# Patient Record
Sex: Female | Born: 1945 | Race: Asian | Hispanic: No | Marital: Married | Smoking: Never smoker
Health system: Southern US, Community
[De-identification: ages and names within clinical notes are randomized; demographics above are authoritative.]

## PROBLEM LIST (undated history)

## (undated) DIAGNOSIS — H409 Unspecified glaucoma: Secondary | ICD-10-CM

## (undated) DIAGNOSIS — F32A Depression, unspecified: Secondary | ICD-10-CM

## (undated) DIAGNOSIS — F329 Major depressive disorder, single episode, unspecified: Secondary | ICD-10-CM

## (undated) DIAGNOSIS — I10 Essential (primary) hypertension: Secondary | ICD-10-CM

## (undated) DIAGNOSIS — E119 Type 2 diabetes mellitus without complications: Secondary | ICD-10-CM

## (undated) HISTORY — DX: Unspecified glaucoma: H40.9

## (undated) HISTORY — PX: EYE SURGERY: SHX253

## (undated) HISTORY — DX: Depression, unspecified: F32.A

## (undated) HISTORY — DX: Major depressive disorder, single episode, unspecified: F32.9

## (undated) HISTORY — PX: TUBAL LIGATION: SHX77

---

## 2004-03-24 ENCOUNTER — Ambulatory Visit (HOSPITAL_COMMUNITY): Admission: RE | Admit: 2004-03-24 | Discharge: 2004-03-24 | Payer: Self-pay | Admitting: Gastroenterology

## 2006-05-10 ENCOUNTER — Ambulatory Visit (HOSPITAL_COMMUNITY): Admission: RE | Admit: 2006-05-10 | Discharge: 2006-05-10 | Payer: Self-pay | Admitting: Gastroenterology

## 2008-04-30 ENCOUNTER — Ambulatory Visit (HOSPITAL_COMMUNITY): Admission: RE | Admit: 2008-04-30 | Discharge: 2008-04-30 | Payer: Self-pay | Admitting: Internal Medicine

## 2011-04-06 NOTE — Op Note (Signed)
NAME:  Robin Perry, Robin Perry                               ACCOUNT NO.:  [id number]   MEDICAL RECORD NO.:  [id number]                   PATIENT TYPE:  AMB   LOCATION:  ENDO                                 FACILITY:  [facility]   PHYSICIAN:  Anselmo Rod, M.D.               DATE OF BIRTH:  1946/10/18   DATE OF PROCEDURE:  03/24/2004  DATE OF DISCHARGE:                                 OPERATIVE REPORT   PROCEDURE PERFORMED:  Screening colonoscopy.   ENDOSCOPIST:  Anselmo Rod, M.D.   INSTRUMENT USED:  Olympus video colonoscope.   INDICATION FOR PROCEDURE:  A 65 year old Asian woman undergoing a screening  colonoscopy.  The patient has a history of abdominal pain and questionable  black stool.  She was guaiac-negative in the office on physical exam.  Rule  out colonic polyps, masses, etc.   PREPROCEDURE PREPARATION:  Informed consent was procured from the patient.  The patient was fasted for eight hours prior to the procedure and prepped  with a bottle of magnesium citrate and a gallon of GoLYTELY the night prior  to the procedure.   PREPROCEDURE PHYSICAL:  VITAL SIGNS:  The patient had stable vital signs.  NECK:  Supple.  CHEST:  Clear to auscultation.  S1, S2 regular.  ABDOMEN:  Soft with normal bowel sounds.   DESCRIPTION OF PROCEDURE:  The patient was placed in the left lateral  decubitus position and sedated with 50 mg of Demerol and 5 mg of Versed in  slow incremental doses.  Once the patient was adequately sedate and  maintained on low-flow oxygen and continuous cardiac monitoring, the  pediatric adjustable Olympus colonoscope was advanced from the rectum to the  cecum.  There was a large amount of residual stool in the right colon.  Multiple washes were done.  Small lesions could have been missed.  The  patient's position was changed from the left lateral to the supine position  to move the stool pool and visualize the underlying mucosa.  The appendiceal  orifice and the ileocecal  valve were visualized and photographed.  No  masses, polyps, erosions, ulcerations, or diverticula were identified.   IMPRESSION:  1. Essentially normal colonoscopy up to the cecum.  2. Large amount of residual stool in the right colon, small lesions could     have been missed.   RECOMMENDATIONS:  1. Repeat CRC screening is recommended in the next five years unless the     patient develops any abnormal     symptoms in the interim.  2. Outpatient follow-up with repeat guaiac testing.  3. Continue on a high-fiber diet with liberal fluid intake.  Anselmo Rod, M.D.    JNM/MEDQ  D:  03/24/2004  Perry:  03/25/2004  Job:  [id number]   cc:   Geoffry Paradise, M.D.  [address]  [place]  [place] [address]  Fax: [phone]   Rudy Jew. Ashley Royalty, M.D.  [address]  [place], [place] [address]  Fax: [phone]

## 2013-08-19 ENCOUNTER — Inpatient Hospital Stay (HOSPITAL_COMMUNITY)
Admission: EM | Admit: 2013-08-19 | Discharge: 2013-08-25 | DRG: 917 | Disposition: A | Discharge status: Other Acute Inpatient Hospital | Payer: Medicare Other | Attending: Internal Medicine | Admitting: Internal Medicine

## 2013-08-19 ENCOUNTER — Emergency Department (HOSPITAL_COMMUNITY): Payer: Medicare Other

## 2013-08-19 ENCOUNTER — Inpatient Hospital Stay (HOSPITAL_COMMUNITY): Payer: Medicare Other

## 2013-08-19 ENCOUNTER — Encounter (HOSPITAL_COMMUNITY): Payer: Self-pay

## 2013-08-19 DIAGNOSIS — E871 Hypo-osmolality and hyponatremia: Secondary | ICD-10-CM

## 2013-08-19 DIAGNOSIS — I10 Essential (primary) hypertension: Secondary | ICD-10-CM

## 2013-08-19 DIAGNOSIS — F29 Unspecified psychosis not due to a substance or known physiological condition: Secondary | ICD-10-CM

## 2013-08-19 DIAGNOSIS — X838XXA Intentional self-harm by other specified means, initial encounter: Secondary | ICD-10-CM

## 2013-08-19 DIAGNOSIS — T50901A Poisoning by unspecified drugs, medicaments and biological substances, accidental (unintentional), initial encounter: Secondary | ICD-10-CM

## 2013-08-19 DIAGNOSIS — E119 Type 2 diabetes mellitus without complications: Secondary | ICD-10-CM | POA: Diagnosis present

## 2013-08-19 DIAGNOSIS — T1491XA Suicide attempt, initial encounter: Secondary | ICD-10-CM | POA: Diagnosis present

## 2013-08-19 DIAGNOSIS — J189 Pneumonia, unspecified organism: Secondary | ICD-10-CM | POA: Diagnosis present

## 2013-08-19 HISTORY — DX: Essential (primary) hypertension: I10

## 2013-08-19 HISTORY — DX: Type 2 diabetes mellitus without complications: E11.9

## 2013-08-19 LAB — COMPREHENSIVE METABOLIC PANEL
AST: 36 U/L (ref 0–37)
Calcium: 8.9 mg/dL (ref 8.4–10.5)
GFR calc Af Amer: 74 mL/min — ABNORMAL LOW (ref >90)
Potassium: 4 mEq/L (ref 3.5–5.1)
Total Bilirubin: 0.9 mg/dL (ref 0.3–1.2)
Total Protein: 6.1 g/dL (ref 6.0–8.3)

## 2013-08-19 LAB — BLOOD GAS, ARTERIAL
Acid-base deficit: 7 mmol/L — ABNORMAL HIGH (ref 0.0–2.0)
Bicarbonate: 16.4 mEq/L — ABNORMAL LOW (ref 20.0–24.0)
Drawn by: 11249
FIO2: 0.21 %
O2 Saturation: 95.5 %
Patient temperature: 98.6

## 2013-08-19 LAB — CBC WITH DIFFERENTIAL/PLATELET
Basophils Absolute: 0 10*3/uL (ref 0.0–0.1)
Basophils Absolute: 0 10*3/uL (ref 0.0–0.1)
Eosinophils Relative: 0 % (ref 0–5)
Eosinophils Relative: 0 % (ref 0–5)
HCT: 36.1 % (ref 36.0–46.0)
Hemoglobin: 12.9 g/dL (ref 12.0–15.0)
Lymphocytes Relative: 12 % (ref 12–46)
Lymphs Abs: 1.2 10*3/uL (ref 0.7–4.0)
Lymphs Abs: 1.3 10*3/uL (ref 0.7–4.0)
MCH: 31.6 pg (ref 26.0–34.0)
MCH: 31.3 pg (ref 26.0–34.0)
MCHC: 36.2 g/dL — ABNORMAL HIGH (ref 30.0–36.0)
MCV: 87.5 fL (ref 78.0–100.0)
MCV: 87.6 fL (ref 78.0–100.0)
Monocytes Absolute: 1.9 10*3/uL — ABNORMAL HIGH (ref 0.1–1.0)
Monocytes Absolute: 1.2 10*3/uL — ABNORMAL HIGH (ref 0.1–1.0)
Monocytes Relative: 12 % (ref 3–12)
Neutro Abs: 14.4 10*3/uL — ABNORMAL HIGH (ref 1.7–7.7)
Neutro Abs: 8 10*3/uL — ABNORMAL HIGH (ref 1.7–7.7)
Neutrophils Relative %: 82 % — ABNORMAL HIGH (ref 43–77)
RBC: 4.55 MIL/uL (ref 3.87–5.11)
RBC: 4.12 MIL/uL (ref 3.87–5.11)
RDW: 13.2 % (ref 11.5–15.5)
WBC: 10.5 10*3/uL (ref 4.0–10.5)

## 2013-08-19 LAB — BASIC METABOLIC PANEL
Chloride: 94 mEq/L — ABNORMAL LOW (ref 96–112)
Chloride: 95 mEq/L — ABNORMAL LOW (ref 96–112)
GFR calc Af Amer: >90 mL/min (ref >90)
GFR calc Af Amer: >90 mL/min (ref >90)
GFR calc non Af Amer: 88 mL/min — ABNORMAL LOW (ref >90)
Glucose, Bld: 144 mg/dL — ABNORMAL HIGH (ref 70–99)
Potassium: 3.5 mEq/L (ref 3.5–5.1)
Potassium: 3.6 mEq/L (ref 3.5–5.1)
Sodium: 126 mEq/L — ABNORMAL LOW (ref 135–145)

## 2013-08-19 LAB — GLUCOSE, CAPILLARY
Glucose-Capillary: 189 mg/dL — ABNORMAL HIGH (ref 70–99)
Glucose-Capillary: 166 mg/dL — ABNORMAL HIGH (ref 70–99)

## 2013-08-19 LAB — URINALYSIS, ROUTINE W REFLEX MICROSCOPIC
Glucose, UA: >1000 mg/dL — AB
Leukocytes, UA: NEGATIVE
Protein, ur: NEGATIVE mg/dL
Specific Gravity, Urine: 1.025 (ref 1.005–1.030)
pH: 5.5 (ref 5.0–8.0)

## 2013-08-19 LAB — OCCULT BLOOD, POC DEVICE: Fecal Occult Bld: POSITIVE — AB

## 2013-08-19 LAB — TYPE AND SCREEN
ABO/RH(D): O POS
Antibody Screen: NEGATIVE

## 2013-08-19 LAB — SODIUM, URINE, RANDOM: Sodium, Ur: 130 mEq/L

## 2013-08-19 LAB — RAPID URINE DRUG SCREEN, HOSP PERFORMED
Benzodiazepines: NOT DETECTED
Tetrahydrocannabinol: NOT DETECTED

## 2013-08-19 LAB — ETHANOL: Alcohol, Ethyl (B): <11 mg/dL (ref 0–11)

## 2013-08-19 LAB — LACTIC ACID, PLASMA: Lactic Acid, Venous: 2.8 mmol/L — ABNORMAL HIGH (ref 0.5–2.2)

## 2013-08-19 LAB — OSMOLALITY, URINE: Osmolality, Ur: 625 mOsm/kg (ref 390–1090)

## 2013-08-19 MED ORDER — CLINDAMYCIN PHOSPHATE 600 MG/50ML IV SOLN
INTRAVENOUS | Status: AC
Start: 2013-08-19 — End: 2013-08-19
  Administered 2013-08-19: 600 mg via INTRAVENOUS
  Filled 2013-08-19: qty 50

## 2013-08-19 MED ORDER — BRIMONIDINE TARTRATE 0.2 % OP SOLN
1.0000 [drp] | Freq: Two times a day (BID) | OPHTHALMIC | Status: DC
  Administered 2013-08-19 – 2013-08-25 (×13): 1 [drp] via OPHTHALMIC
  Filled 2013-08-19: qty 5

## 2013-08-19 MED ORDER — ONDANSETRON HCL 4 MG/2ML IJ SOLN: 4.0000 mg | Freq: Four times a day (QID) | INTRAMUSCULAR | Status: DC | PRN

## 2013-08-19 MED ORDER — IOHEXOL 300 MG/ML  SOLN
80.0000 mL | Freq: Once | INTRAMUSCULAR | Status: AC | PRN
  Administered 2013-08-19: 80 mL via INTRAVENOUS

## 2013-08-19 MED ORDER — MORPHINE SULFATE 4 MG/ML IJ SOLN
INTRAMUSCULAR | Status: AC
  Administered 2013-08-19: 4 mg via INTRAVENOUS
  Filled 2013-08-19: qty 1

## 2013-08-19 MED ORDER — SUCRALFATE 1 GM/10ML PO SUSP
1.0000 g | Freq: Four times a day (QID) | ORAL | Status: DC
  Administered 2013-08-19 (×2): 1 g via ORAL
  Filled 2013-08-19 (×4): qty 10

## 2013-08-19 MED ORDER — SUCRALFATE 1 GM/10ML PO SUSP
1.0000 g | Freq: Four times a day (QID) | ORAL | Status: DC
  Administered 2013-08-19 – 2013-08-25 (×22): 1 g via ORAL
  Filled 2013-08-19 (×25): qty 10

## 2013-08-19 MED ORDER — ALBUTEROL SULFATE (5 MG/ML) 0.5% IN NEBU
2.5000 mg | INHALATION_SOLUTION | RESPIRATORY_TRACT | Status: DC | PRN
  Administered 2013-08-19 – 2013-08-21 (×4): 2.5 mg via RESPIRATORY_TRACT
  Filled 2013-08-19 (×4): qty 0.5

## 2013-08-19 MED ORDER — ONDANSETRON HCL 4 MG/2ML IJ SOLN
4.0000 mg | Freq: Once | INTRAMUSCULAR | Status: AC
  Administered 2013-08-19: 4 mg via INTRAVENOUS

## 2013-08-19 MED ORDER — SODIUM CHLORIDE 0.9 % IJ SOLN
3.0000 mL | Freq: Two times a day (BID) | INTRAMUSCULAR | Status: DC
  Administered 2013-08-19 – 2013-08-25 (×8): 3 mL via INTRAVENOUS

## 2013-08-19 MED ORDER — MORPHINE SULFATE 4 MG/ML IJ SOLN
4.0000 mg | Freq: Once | INTRAMUSCULAR | Status: AC
  Administered 2013-08-19: 4 mg via INTRAVENOUS

## 2013-08-19 MED ORDER — ACETAMINOPHEN 650 MG RE SUPP: 650.0000 mg | Freq: Four times a day (QID) | RECTAL | Status: DC | PRN

## 2013-08-19 MED ORDER — SODIUM CHLORIDE 0.9 % IV BOLUS (SEPSIS)
1000.0000 mL | Freq: Once | INTRAVENOUS | Status: AC
  Administered 2013-08-19: 1000 mL via INTRAVENOUS

## 2013-08-19 MED ORDER — MORPHINE SULFATE 2 MG/ML IJ SOLN
1.0000 mg | INTRAMUSCULAR | Status: DC | PRN
  Administered 2013-08-19 – 2013-08-20 (×4): 1 mg via INTRAVENOUS
  Filled 2013-08-19 (×4): qty 1

## 2013-08-19 MED ORDER — ACETAMINOPHEN 325 MG PO TABS: 650.0000 mg | ORAL_TABLET | Freq: Four times a day (QID) | ORAL | Status: DC | PRN

## 2013-08-19 MED ORDER — ONDANSETRON HCL 4 MG/2ML IJ SOLN
INTRAMUSCULAR | Status: AC
  Filled 2013-08-19: qty 2

## 2013-08-19 MED ORDER — SODIUM CHLORIDE 0.9 % IV SOLN
80.0000 mg | Freq: Once | INTRAVENOUS | Status: AC
  Administered 2013-08-19: 80 mg via INTRAVENOUS
  Filled 2013-08-19: qty 80

## 2013-08-19 MED ORDER — HYDRALAZINE HCL 20 MG/ML IJ SOLN: 10.0000 mg | INTRAMUSCULAR | Status: DC | PRN

## 2013-08-19 MED ORDER — CLINDAMYCIN PHOSPHATE 600 MG/50ML IV SOLN
600.0000 mg | Freq: Three times a day (TID) | INTRAVENOUS | Status: DC
  Administered 2013-08-19 – 2013-08-23 (×13): 600 mg via INTRAVENOUS
  Filled 2013-08-19 (×17): qty 50

## 2013-08-19 MED ORDER — PNEUMOCOCCAL VAC POLYVALENT 25 MCG/0.5ML IJ INJ
0.5000 mL | INJECTION | INTRAMUSCULAR | Status: AC
  Administered 2013-08-20: 10:00:00 0.5 mL via INTRAMUSCULAR
  Filled 2013-08-19 (×2): qty 0.5

## 2013-08-19 MED ORDER — GI COCKTAIL ~~LOC~~
30.0000 mL | Freq: Once | ORAL | Status: AC
  Administered 2013-08-19: 30 mL via ORAL

## 2013-08-19 MED ORDER — INSULIN ASPART 100 UNIT/ML ~~LOC~~ SOLN
0.0000 [IU] | Freq: Three times a day (TID) | SUBCUTANEOUS | Status: DC
  Administered 2013-08-19 (×2): 2 [IU] via SUBCUTANEOUS
  Administered 2013-08-19: 10:00:00 3 [IU] via SUBCUTANEOUS
  Administered 2013-08-20 – 2013-08-21 (×6): 2 [IU] via SUBCUTANEOUS
  Administered 2013-08-22: 1 [IU] via SUBCUTANEOUS
  Administered 2013-08-22: 08:00:00 via SUBCUTANEOUS
  Administered 2013-08-22: 2 [IU] via SUBCUTANEOUS
  Administered 2013-08-23: 3 [IU] via SUBCUTANEOUS
  Administered 2013-08-23: 5 [IU] via SUBCUTANEOUS
  Administered 2013-08-23: 3 [IU] via SUBCUTANEOUS
  Administered 2013-08-24: 2 [IU] via SUBCUTANEOUS
  Administered 2013-08-24: 5 [IU] via SUBCUTANEOUS

## 2013-08-19 MED ORDER — GI COCKTAIL ~~LOC~~
ORAL | Status: AC
  Filled 2013-08-19: qty 30

## 2013-08-19 MED ORDER — INFLUENZA VAC SPLIT QUAD 0.5 ML IM SUSP
0.5000 mL | INTRAMUSCULAR | Status: AC
  Administered 2013-08-20: 0.5 mL via INTRAMUSCULAR
  Filled 2013-08-19 (×2): qty 0.5

## 2013-08-19 MED ORDER — PANTOPRAZOLE SODIUM 40 MG IV SOLR
40.0000 mg | Freq: Two times a day (BID) | INTRAVENOUS | Status: DC
  Administered 2013-08-19 – 2013-08-25 (×12): 40 mg via INTRAVENOUS
  Filled 2013-08-19 (×14): qty 40

## 2013-08-19 MED ORDER — CLINDAMYCIN PHOSPHATE 600 MG/50ML IV SOLN
600.0000 mg | Freq: Once | INTRAVENOUS | Status: DC
  Administered 2013-08-19: 600 mg via INTRAVENOUS

## 2013-08-19 MED ORDER — SODIUM CHLORIDE 0.9 % IV SOLN
INTRAVENOUS | Status: AC
  Administered 2013-08-19 – 2013-08-20 (×2): via INTRAVENOUS

## 2013-08-19 MED ORDER — ONDANSETRON HCL 4 MG PO TABS: 4.0000 mg | ORAL_TABLET | Freq: Four times a day (QID) | ORAL | Status: DC | PRN

## 2013-08-19 MED ORDER — DORZOLAMIDE HCL-TIMOLOL MAL 2-0.5 % OP SOLN
1.0000 [drp] | Freq: Two times a day (BID) | OPHTHALMIC | Status: DC
  Administered 2013-08-19 – 2013-08-25 (×13): 1 [drp] via OPHTHALMIC
  Filled 2013-08-19: qty 10

## 2013-08-19 NOTE — ED Notes (Signed)
Bed: ZO10 Expected date: 08/19/13 Expected time: 12:19 AM Means of arrival: Ambulance Comments: Vomiting blood/drank bleach

## 2013-08-19 NOTE — Progress Notes (Signed)
INITIAL NUTRITION ASSESSMENT  DOCUMENTATION CODES Per approved criteria  -Not Applicable   INTERVENTION: RD to follow along for diet advancement and assess need for addition of interventions accordingly.  NUTRITION DIAGNOSIS: Inadequate oral intake related to inability to eat as evidenced by NPO status.   Goal: Diet advancement per team. Intake to meet at least 90% of estimated needs.  Monitor:  weight trends, lab trends, I/O's, diet advancement  Reason for Assessment: Malnutrition Screening Tools  67 y.o. female  Admitting Dx: Suicide attempt  ASSESSMENT: PMHx significant for DM2 and HTN. Admitted s/p suicide attempt; pt consumed 2 cups of Clorox plus handful of home medications of metformin, glipizide, lisinopril.   Pt was throwing up blood PTA. Pt reports that her appetite was minimal PTA, doesn't explain why. She states that her normal weight is 118 lb; current weight is 114 lb. Pt appears well nourished.  Blood sugars ranging from 189-276. Sodium is currently low at 122.   Height: Ht Readings from Last 1 Encounters:  08/19/13 5' (1.524 m)    Weight: Wt Readings from Last 1 Encounters:  08/19/13 114 lb 8 oz (51.937 kg)    Ideal Body Weight: 100 lb  % Ideal Body Weight: 114%  Wt Readings from Last 10 Encounters:  08/19/13 114 lb 8 oz (51.937 kg)    Usual Body Weight: 118 lb (per pt)  % Usual Body Weight: 97%  BMI:  Body mass index is 22.36 kg/(m^2). WNL  Estimated Nutritional Needs: Kcal: 1200 - 1400 Protein: 50 - 60 g  Fluid: 1.2 - 1.4 liters  Skin: intact  Diet Order: NPO  EDUCATION NEEDS: -No education needs identified at this time   Intake/Output Summary (Last 24 hours) at 08/19/13 1130 Last data filed at 08/19/13 1119  Gross per 24 hour  Intake  28.33 ml  Output   1100 ml  Net -1071.67 ml    Last BM: 10/1  Labs:   Recent Labs Lab 08/19/13 0145  NA 122*  K 4.0  CL 90*  CO2 16*  BUN 31*  CREATININE 0.92  CALCIUM 8.9   GLUCOSE 276*    CBG (last 3)   Recent Labs  08/19/13 0642 08/19/13 0816  GLUCAP 189* 215*    Scheduled Meds: . brimonidine  1 drop Both Eyes BID  . clindamycin (CLEOCIN) IV  600 mg Intravenous Q8H  . dorzolamide-timolol  1 drop Both Eyes BID  . [START ON 08/20/2013] influenza vac split quadrivalent PF  0.5 mL Intramuscular Tomorrow-1000  . insulin aspart  0-9 Units Subcutaneous TID WC  . pantoprazole (PROTONIX) IV  40 mg Intravenous Q12H  . [START ON 08/20/2013] pneumococcal 23 valent vaccine  0.5 mL Intramuscular Tomorrow-1000  . sodium chloride  3 mL Intravenous Q12H  . sucralfate  1 g Oral Q6H    Continuous Infusions: . sodium chloride 100 mL/hr at 08/19/13 4782    Past Medical History  Diagnosis Date  . Hypertension   . Diabetes mellitus without complication     Past Surgical History  Procedure Laterality Date  . Eye surgery    . Tubal ligation      Jarold Motto MS, RD, LDN Pager: [phone] After-hours pager: [phone]

## 2013-08-19 NOTE — Progress Notes (Signed)
[facility] PROGRESS NOTE  ADAMARIE IZZO HKV:[id number] DOB: 14-Jul-1946 DOA: 08/19/2013 PCP: No primary provider on file. bRIEF HPI: Robin Perry is a 67 y.o. female history of diabetes mellitus type 2, hypertension was brought to the ER after patient admitted to her husband that she had taken Clorox. Patient was feeling depressed yesterday morning when she woke up and then she took 2 cups of Clorox(do not know the exact quantity) with a handful of her home medications which includes metformin/glipizide/lisinopril/Tylenol. Patient otherwise is hemodynamically stable. Salicylates and Tylenol levels are negative. Urine drug screen is negative. Poison control was consulted. At this time they have recommended observation and if patient continues to have bleeding and needs GI evaluation for any persistent bleed from the Clorox. CT CHEST shows Thickened, indistinct esophageal and gastric walls concerning for esophagitis and gastritis a. Adjacent scratch head free fluid adjacent to the stomach and spleen in the left upper abdomen. Airspace disease in the right middle lobe concerning for pneumonia  or pneumonitis. Dependent atelectasis in the lungs. GI consulted, and she was started on IV protonix and IV clindamycin.     Assessment/Plan:  Suicidal attempt with multiple drugs - suicide precautions with sitter.  Patient will be hydrated and closely follow CBC and metabolic panel. Psychiatry consulted.    #2. GI bleed - probably secondary to Clorox ingestion. Keep patient n.p.o. Protonix IV. Closely follow CBC. Type and screen. Consulted GI.   #3 Possible pneumonitis/pneumonia - CT chest is done and showing pneumonitis.  Patient has been placed on clindamycin for possible aspiration pneumonitis. When necessary albuterol.   #4 Severe hyponatremia - at this time and gently hydrating. Closely follow metabolic panel. Check urine sodium and osmolality and check TSH.   #5 Chest pain - probably secondary to Clorox  ingestion.  Troponin negative.  #6 Diabetes mellitus type 2 - presently patient is being placed on sliding-scale coverage as patient is n.p.o. Patient has taken glipizide and may have late effect hyperglycemia. Closely follow CBGs.  #7History of hypertension - patient did take lisinopril also yesterday. Closely observe vital signs.   Code Status: full code Family Communication: family at bedside Disposition Plan: pending.    Consultants:  GI CONSULT from Dr Loreta Ave  Psychiatry consult  Procedures:  None so far  Antibiotics:  IV clindamycin.   HPI/Subjective: Reports right sided chest pain and sub sternal burning sensation.   Objective: Filed Vitals:   08/19/13 0607  BP: 147/66  Pulse: 93  Temp: 98.4 F (36.9 C)  Resp: 20    Intake/Output Summary (Last 24 hours) at 08/19/13 1012 Last data filed at 08/19/13 0916  Gross per 24 hour  Intake  28.33 ml  Output    900 ml  Net -871.67 ml   Filed Weights   08/19/13 5643  Weight: 51.937 kg (114 lb 8 oz)    Exam:   General:  Alert afebrile comfortable  Cardiovascular: s1s2  Respiratory: scattered rhonchi  Abdomen: soft MILDLY tender in the epigastric area  Musculoskeletal: no pedal edema.   Data Reviewed: Basic Metabolic Panel:  Recent Labs Lab 08/19/13 0145  NA 122*  K 4.0  CL 90*  CO2 16*  GLUCOSE 276*  BUN 31*  CREATININE 0.92  CALCIUM 8.9   Liver Function Tests:  Recent Labs Lab 08/19/13 0145  AST 36  ALT 16  ALKPHOS 53  BILITOT 0.9  PROT 6.1  ALBUMIN 3.5   No results found for this basename: LIPASE, AMYLASE,  in the last  168 hours No results found for this basename: AMMONIA,  in the last 168 hours CBC:  Recent Labs Lab 08/19/13 0145 08/19/13 0645  WBC 17.5* 10.5  NEUTROABS 14.4* 8.0*  HGB 14.4 12.9  HCT 39.8 36.1  MCV 87.5 87.6  PLT 187 150   Cardiac Enzymes:  Recent Labs Lab 08/19/13 0450  TROPONINI <0.30   BNP (last 3 results) No results found for this basename:  PROBNP,  in the last 8760 hours CBG:  Recent Labs Lab 08/19/13 0642 08/19/13 0816  GLUCAP 189* 215*    No results found for this or any previous visit (from the past 240 hour(s)).   Studies: Dg Chest 2 View  08/19/2013   CLINICAL DATA:  Swallowed bleach.  Cough.  EXAM: CHEST  2 VIEW  COMPARISON:  None.  FINDINGS: Airspace opacity in the right middle lobe and possibly within the lingula. Heart is normal size. No effusions or acute bony abnormality.  IMPRESSION: Right middle lobe consolidation. Possible early opacity in the lingula. Findings concerning for pneumonia or pneumonitis.   Electronically Signed   By: Charlett Nose M.D.   On: 08/19/2013 02:39   Ct Chest W Contrast  08/19/2013   CLINICAL DATA:  Swallowed bleach. Vomiting blood.  EXAM: CT CHEST WITH CONTRAST  TECHNIQUE: Multidetector CT imaging of the chest was performed during intravenous contrast administration.  CONTRAST:  80mL OMNIPAQUE IOHEXOL 300 MG/ML  SOLN  COMPARISON:  CHEST x-ray earlier today.  FINDINGS: There appears to be diffuse wall thickening and surrounding haziness about the entire esophagus. This could represent changes of esophagitis related to caustic ingestion. No pneumomediastinum to suggest esophageal perforation. Heart is normal size. Scattered coronary artery calcifications. Ascending aortic diameter is slightly ectatic at 3.8 cm. No mediastinal, hilar, or axillary adenopathy.  Airspace disease noted in the right middle lobe concerning for pneumonia or pneumonitis. Dependent atelectasis posteriorly in the lower lobes. No pleural effusions.  Chest wall soft tissues are unremarkable. No acute bony abnormality.  Imaging into the upper abdomen demonstrates edematous, indistinct gastric walls concerning for gastritis. There is free fluid in the left upper abdomen adjacent to the stomach and spleen.  IMPRESSION: Thickened, indistinct esophageal and gastric walls concerning for esophagitis and gastritis a. Adjacent scratch  head free fluid adjacent to the stomach and spleen in the left upper abdomen.  Airspace disease in the right middle lobe concerning for pneumonia or pneumonitis. Dependent atelectasis in the lungs.  Scattered coronary artery calcifications.   Electronically Signed   By: Charlett Nose M.D.   On: 08/19/2013 06:13    Scheduled Meds: . brimonidine  1 drop Both Eyes BID  . clindamycin (CLEOCIN) IV  600 mg Intravenous Q8H  . dorzolamide-timolol  1 drop Both Eyes BID  . [START ON 08/20/2013] influenza vac split quadrivalent PF  0.5 mL Intramuscular Tomorrow-1000  . insulin aspart  0-9 Units Subcutaneous TID WC  . pantoprazole (PROTONIX) IV  40 mg Intravenous Q12H  . [START ON 08/20/2013] pneumococcal 23 valent vaccine  0.5 mL Intramuscular Tomorrow-1000  . sodium chloride  3 mL Intravenous Q12H   Continuous Infusions: . sodium chloride 100 mL/hr at 08/19/13 4098    Principal Problem:   Suicide attempt Active Problems:   Diabetes mellitus   HTN (hypertension)   Pneumonitis    Time spent: >30 min    Shammond Arave  [facility] Pager [phone] If 7PM-7AM, please contact night-coverage at [email or web address], password [facility] 08/19/2013, 10:12 AM  LOS: 0 days

## 2013-08-19 NOTE — ED Notes (Signed)
Pt admits to EMS to drinking bleach yesterday, has hx of paranoia but not dx with any mental illness, pt complains of vomiting blood today

## 2013-08-19 NOTE — Consult Note (Signed)
[facility] Face-to-Face Psychiatry Consult   Reason for Consult:  Suicidal attempt Referring Physician:  Dr Kathline Magic is an 67 y.o. female.  Assessment: AXIS I:  Psychotic Disorder NOS AXIS II:  Deferred AXIS III:   Past Medical History  Diagnosis Date  . Hypertension   . Diabetes mellitus without complication    AXIS IV:  other psychosocial or environmental problems and problems related to social environment AXIS V:  1-10 persistent dangerousness to self and others present  Plan:  Recommend psychiatric Inpatient admission when medically cleared.  Subjective:   [facility] is a 67 y.o. female patient admitted with suicidal attempt.  HPI:  Patient is 67 year-old [place] married female who was admitted on the medical floor after taking Clorox and multiple medication in a suicidal attempt.  Patient speaks little English and most of the information was obtained from her daughter who was with her.  Apparently patient having auditory hallucination and paranoid thinking the past few months.  She believes people are calling her name, talking to her and making derogatory remarks.  Patient admitted voices telling her that she is ugly and she has no right to live.  Patient admitted to feeling very sad depressed and started to hate herself and admitted does not want to live anymore.  As per daughter and family member symptoms started when she had a cruise trip to [place] this March.  Since then she has difficulty falling asleep, irritable, angry, hallucinating and having paranoid thinking.  She admitted having visual and auditory hallucination and becoming more isolated and withdrawn and loss of motivation.  One month ago her daughter moved out however daughter does not believe it has contributed to her psychosis.  Her daughter reported that patient is not sleeping for past few months.  She has noticed talking to herself and does not go to public places because of paranoia .  Patient denies any  previous history of suicidal attempts or any inpatient psychiatric treatment.  Her UDS is negative. HPI Elements:   Location:  Medical floor. Quality:  poor. Severity:  Severe.  Past Psychiatric History: Past Medical History  Diagnosis Date  . Hypertension   . Diabetes mellitus without complication     reports that she has never smoked. She does not have any smokeless tobacco history on file. She reports that she does not drink alcohol or use illicit drugs. Family History  Problem Relation Age of Onset  . Diabetes Mellitus II Mother   . Diabetes Mellitus II Father      Living Arrangements: Spouse/significant other   Abuse/Neglect [facility]) Physical Abuse: Denies Verbal Abuse: Denies Sexual Abuse: Denies Allergies:  No Known Allergies  ACT Assessment Complete:  No:   Past Psychiatric History: No past psychiatric history of any suicidal attempt. Place of Residence:  Lives with her husband Marital Status:  Married Employed/Unemployed:  Unemployed Education:  Unknown Family Supports:  Husband and daughter. Objective: Blood pressure 118/67, pulse 89, temperature 98.4 F (36.9 C), temperature source Oral, resp. rate 20, height 5' (1.524 m), weight 114 lb 8 oz (51.937 kg), SpO2 96.00%.Body mass index is 22.36 kg/(m^2). Results for orders placed during the hospital encounter of 08/19/13 (from the past 72 hour(s))  CBC WITH DIFFERENTIAL     Status: Abnormal   Collection Time    08/19/13  1:45 AM      Result Value Range   WBC 17.5 (*) 4.0 - 10.5 K/uL   RBC 4.55  3.87 - 5.11  MIL/uL   Hemoglobin 14.4  12.0 - 15.0 g/dL   HCT 96.0  45.4 - 09.8 %   MCV 87.5  78.0 - 100.0 fL   MCH 31.6  26.0 - 34.0 pg   MCHC 36.2 (*) 30.0 - 36.0 g/dL   RDW 11.9  14.7 - 82.9 %   Platelets 187  150 - 400 K/uL   Neutrophils Relative % 82 (*) 43 - 77 %   Neutro Abs 14.4 (*) 1.7 - 7.7 K/uL   Lymphocytes Relative 7 (*) 12 - 46 %   Lymphs Abs 1.2  0.7 - 4.0 K/uL   Monocytes Relative 11  3 - 12 %    Monocytes Absolute 1.9 (*) 0.1 - 1.0 K/uL   Eosinophils Relative 0  0 - 5 %   Eosinophils Absolute 0.0  0.0 - 0.7 K/uL   Basophils Relative 0  0 - 1 %   Basophils Absolute 0.0  0.0 - 0.1 K/uL  COMPREHENSIVE METABOLIC PANEL     Status: Abnormal   Collection Time    08/19/13  1:45 AM      Result Value Range   Sodium 122 (*) 135 - 145 mEq/L   Potassium 4.0  3.5 - 5.1 mEq/L   Chloride 90 (*) 96 - 112 mEq/L   CO2 16 (*) 19 - 32 mEq/L   Glucose, Bld 276 (*) 70 - 99 mg/dL   BUN 31 (*) 6 - 23 mg/dL   Creatinine, Ser 5.62  0.50 - 1.10 mg/dL   Calcium 8.9  8.4 - 13.0 mg/dL   Total Protein 6.1  6.0 - 8.3 g/dL   Albumin 3.5  3.5 - 5.2 g/dL   AST 36  0 - 37 U/L   ALT 16  0 - 35 U/L   Alkaline Phosphatase 53  39 - 117 U/L   Total Bilirubin 0.9  0.3 - 1.2 mg/dL   GFR calc non Af Amer 64 (*) >90 mL/min   GFR calc Af Amer 74 (*) >90 mL/min   Comment: (NOTE)     The eGFR has been calculated using the CKD EPI equation.     This calculation has not been validated in all clinical situations.     eGFR's persistently <90 mL/min signify possible Chronic Kidney     Disease.  ETHANOL     Status: None   Collection Time    08/19/13  1:45 AM      Result Value Range   Alcohol, Ethyl (B) <11  0 - 11 mg/dL   Comment:            LOWEST DETECTABLE LIMIT FOR     SERUM ALCOHOL IS 11 mg/dL     FOR MEDICAL PURPOSES ONLY     Performed at [facility]  SALICYLATE LEVEL     Status: Abnormal   Collection Time    08/19/13  1:45 AM      Result Value Range   Salicylate Lvl <2.0 (*) 2.8 - 20.0 mg/dL  ACETAMINOPHEN LEVEL     Status: None   Collection Time    08/19/13  1:45 AM      Result Value Range   Acetaminophen (Tylenol), Serum <15.0  10 - 30 ug/mL   Comment:            THERAPEUTIC CONCENTRATIONS VARY     SIGNIFICANTLY. A RANGE OF 10-30     ug/mL MAY BE AN EFFECTIVE     CONCENTRATION FOR MANY PATIENTS.  HOWEVER, SOME ARE BEST TREATED     AT CONCENTRATIONS OUTSIDE THIS     RANGE.      ACETAMINOPHEN CONCENTRATIONS     >150 ug/mL AT 4 HOURS AFTER     INGESTION AND >50 ug/mL AT 12     HOURS AFTER INGESTION ARE     OFTEN ASSOCIATED WITH TOXIC     REACTIONS.  OCCULT BLOOD, POC DEVICE     Status: Abnormal   Collection Time    08/19/13  2:16 AM      Result Value Range   Fecal Occult Bld POSITIVE (*) NEGATIVE  URINE RAPID DRUG SCREEN (HOSP PERFORMED)     Status: None   Collection Time    08/19/13  4:01 AM      Result Value Range   Opiates NONE DETECTED  NONE DETECTED   Cocaine NONE DETECTED  NONE DETECTED   Benzodiazepines NONE DETECTED  NONE DETECTED   Amphetamines NONE DETECTED  NONE DETECTED   Tetrahydrocannabinol NONE DETECTED  NONE DETECTED   Barbiturates NONE DETECTED  NONE DETECTED   Comment:            DRUG SCREEN FOR MEDICAL PURPOSES     ONLY.  IF CONFIRMATION IS NEEDED     FOR ANY PURPOSE, NOTIFY LAB     WITHIN 5 DAYS.                LOWEST DETECTABLE LIMITS     FOR URINE DRUG SCREEN     Drug Class       Cutoff (ng/mL)     Amphetamine      1000     Barbiturate      200     Benzodiazepine   200     Tricyclics       300     Opiates          300     Cocaine          300     THC              50  URINALYSIS, ROUTINE W REFLEX MICROSCOPIC     Status: Abnormal   Collection Time    08/19/13  4:01 AM      Result Value Range   Color, Urine YELLOW  YELLOW   APPearance CLEAR  CLEAR   Specific Gravity, Urine 1.025  1.005 - 1.030   pH 5.5  5.0 - 8.0   Glucose, UA >1000 (*) NEGATIVE mg/dL   Hgb urine dipstick NEGATIVE  NEGATIVE   Bilirubin Urine NEGATIVE  NEGATIVE   Ketones, ur NEGATIVE  NEGATIVE mg/dL   Protein, ur NEGATIVE  NEGATIVE mg/dL   Urobilinogen, UA 0.2  0.0 - 1.0 mg/dL   Nitrite NEGATIVE  NEGATIVE   Leukocytes, UA NEGATIVE  NEGATIVE  URINE MICROSCOPIC-ADD ON     Status: None   Collection Time    08/19/13  4:01 AM      Result Value Range   Squamous Epithelial / LPF RARE  RARE   WBC, UA 0-2  <3 WBC/hpf  BLOOD GAS, ARTERIAL     Status: Abnormal    Collection Time    08/19/13  4:27 AM      Result Value Range   FIO2 0.21     pH, Arterial 7.386  7.350 - 7.450   pCO2 arterial 27.9 (*) 35.0 - 45.0 mmHg   pO2, Arterial 79.5 (*) 80.0 - 100.0 mmHg   Bicarbonate 16.4 (*)  20.0 - 24.0 mEq/L   TCO2 14.7  0 - 100 mmol/L   Acid-base deficit 7.0 (*) 0.0 - 2.0 mmol/L   O2 Saturation 95.5     Patient temperature 98.6     Collection site LEFT RADIAL     Drawn by [phone]     Sample type ARTERIAL DRAW     Allens test (pass/fail) PASS  PASS  KETONES, QUALITATIVE     Status: None   Collection Time    08/19/13  4:35 AM      Result Value Range   Acetone, Bld NEGATIVE  NEGATIVE  TYPE AND SCREEN     Status: None   Collection Time    08/19/13  4:35 AM      Result Value Range   ABO/RH(D) O POS     Antibody Screen NEG     Sample Expiration 08/22/2013    ABO/RH     Status: None   Collection Time    08/19/13  4:35 AM      Result Value Range   ABO/RH(D) O POS    LACTIC ACID, PLASMA     Status: Abnormal   Collection Time    08/19/13  4:50 AM      Result Value Range   Lactic Acid, Venous 2.8 (*) 0.5 - 2.2 mmol/L  TROPONIN I     Status: None   Collection Time    08/19/13  4:50 AM      Result Value Range   Troponin I <0.30  <0.30 ng/mL   Comment:            Due to the release kinetics of cTnI,     a negative result within the first hours     of the onset of symptoms does not rule out     myocardial infarction with certainty.     If myocardial infarction is still suspected,     repeat the test at appropriate intervals.  GLUCOSE, CAPILLARY     Status: Abnormal   Collection Time    08/19/13  6:42 AM      Result Value Range   Glucose-Capillary 189 (*) 70 - 99 mg/dL   Comment 1 Documented in Chart     Comment 2 Notify RN    TSH     Status: None   Collection Time    08/19/13  6:45 AM      Result Value Range   TSH 1.754  0.350 - 4.500 uIU/mL   Comment: Performed at [facility]  CBC WITH DIFFERENTIAL     Status: Abnormal    Collection Time    08/19/13  6:45 AM      Result Value Range   WBC 10.5  4.0 - 10.5 K/uL   RBC 4.12  3.87 - 5.11 MIL/uL   Hemoglobin 12.9  12.0 - 15.0 g/dL   HCT 60.4  54.0 - 98.1 %   MCV 87.6  78.0 - 100.0 fL   MCH 31.3  26.0 - 34.0 pg   MCHC 35.7  30.0 - 36.0 g/dL   RDW 19.1  47.8 - 29.5 %   Platelets 150  150 - 400 K/uL   Neutrophils Relative % 76  43 - 77 %   Neutro Abs 8.0 (*) 1.7 - 7.7 K/uL   Lymphocytes Relative 12  12 - 46 %   Lymphs Abs 1.3  0.7 - 4.0 K/uL   Monocytes Relative 12  3 - 12 %   Monocytes Absolute  1.2 (*) 0.1 - 1.0 K/uL   Eosinophils Relative 0  0 - 5 %   Eosinophils Absolute 0.0  0.0 - 0.7 K/uL   Basophils Relative 0  0 - 1 %   Basophils Absolute 0.0  0.0 - 0.1 K/uL  GLUCOSE, CAPILLARY     Status: Abnormal   Collection Time    08/19/13  8:16 AM      Result Value Range   Glucose-Capillary 215 (*) 70 - 99 mg/dL  SODIUM, URINE, RANDOM     Status: None   Collection Time    08/19/13  9:16 AM      Result Value Range   Sodium, Ur 130     Comment: Performed at [facility]  OSMOLALITY, URINE     Status: None   Collection Time    08/19/13  9:16 AM      Result Value Range   Osmolality, Ur 625  390 - 1090 mOsm/kg   Comment: Performed at [facility]  BASIC METABOLIC PANEL     Status: Abnormal   Collection Time    08/19/13 10:45 AM      Result Value Range   Sodium 126 (*) 135 - 145 mEq/L   Potassium 3.5  3.5 - 5.1 mEq/L   Chloride 94 (*) 96 - 112 mEq/L   CO2 22  19 - 32 mEq/L   Glucose, Bld 209 (*) 70 - 99 mg/dL   BUN 20  6 - 23 mg/dL   Comment: REPEATED TO VERIFY     DELTA CHECK NOTED   Creatinine, Ser 0.72  0.50 - 1.10 mg/dL   Calcium 8.0 (*) 8.4 - 10.5 mg/dL   GFR calc non Af Amer 88 (*) >90 mL/min   GFR calc Af Amer >90  >90 mL/min   Comment: (NOTE)     The eGFR has been calculated using the CKD EPI equation.     This calculation has not been validated in all clinical situations.     eGFR's persistently <90 mL/min signify  possible Chronic Kidney     Disease.  GLUCOSE, CAPILLARY     Status: Abnormal   Collection Time    08/19/13 11:50 AM      Result Value Range   Glucose-Capillary 185 (*) 70 - 99 mg/dL  BASIC METABOLIC PANEL     Status: Abnormal   Collection Time    08/19/13  1:46 PM      Result Value Range   Sodium 127 (*) 135 - 145 mEq/L   Potassium 3.6  3.5 - 5.1 mEq/L   Chloride 95 (*) 96 - 112 mEq/L   CO2 21  19 - 32 mEq/L   Glucose, Bld 144 (*) 70 - 99 mg/dL   BUN 18  6 - 23 mg/dL   Creatinine, Ser 0.27  0.50 - 1.10 mg/dL   Calcium 8.1 (*) 8.4 - 10.5 mg/dL   GFR calc non Af Amer >90  >90 mL/min   GFR calc Af Amer >90  >90 mL/min   Comment: (NOTE)     The eGFR has been calculated using the CKD EPI equation.     This calculation has not been validated in all clinical situations.     eGFR's persistently <90 mL/min signify possible Chronic Kidney     Disease.   Labs are reviewed and are pertinent for sodium 122 and WBC count 17.5.  Current Facility-Administered Medications  Medication Dose Route Frequency Provider Last Rate Last Dose  . 0.9 %  sodium chloride infusion   Intravenous Continuous Eduard Clos, MD 100 mL/hr at 08/19/13 [phone]    . acetaminophen (TYLENOL) tablet 650 mg  650 mg Oral Q6H PRN Eduard Clos, MD       Or  . acetaminophen (TYLENOL) suppository 650 mg  650 mg Rectal Q6H PRN Eduard Clos, MD      . albuterol (PROVENTIL) (5 MG/ML) 0.5% nebulizer solution 2.5 mg  2.5 mg Nebulization Q2H PRN Eduard Clos, MD      . brimonidine (ALPHAGAN) 0.2 % ophthalmic solution 1 drop  1 drop Both Eyes BID Eduard Clos, MD   1 drop at 08/19/13 1121  . clindamycin (CLEOCIN) IVPB 600 mg  600 mg Intravenous Q8H Eduard Clos, MD   600 mg at 08/19/13 1513  . dorzolamide-timolol (COSOPT) 22.3-6.8 MG/ML ophthalmic solution 1 drop  1 drop Both Eyes BID Eduard Clos, MD   1 drop at 08/19/13 1122  . hydrALAZINE (APRESOLINE) injection 10 mg  10 mg  Intravenous Q4H PRN Eduard Clos, MD      . Melene Muller ON 08/20/2013] influenza vac split quadrivalent PF (FLUARIX) injection 0.5 mL  0.5 mL Intramuscular Tomorrow-1000 Eduard Clos, MD      . insulin aspart (novoLOG) injection 0-9 Units  0-9 Units Subcutaneous TID WC Eduard Clos, MD   2 Units at 08/19/13 1159  . morphine 2 MG/ML injection 1 mg  1 mg Intravenous Q4H PRN Eduard Clos, MD   1 mg at 08/19/13 1608  . ondansetron (ZOFRAN) tablet 4 mg  4 mg Oral Q6H PRN Eduard Clos, MD       Or  . ondansetron [facility]) injection 4 mg  4 mg Intravenous Q6H PRN Eduard Clos, MD      . pantoprazole (PROTONIX) injection 40 mg  40 mg Intravenous Q12H Eduard Clos, MD      . Melene Muller ON 08/20/2013] pneumococcal 23 valent vaccine (PNU-IMMUNE) injection 0.5 mL  0.5 mL Intramuscular Tomorrow-1000 Eduard Clos, MD      . sodium chloride 0.9 % injection 3 mL  3 mL Intravenous Q12H Eduard Clos, MD      . sucralfate (CARAFATE) 1 GM/10ML suspension 1 g  1 g Oral Q6H Kathlen Mody, MD   1 g at 08/19/13 1159    Psychiatric Specialty Exam:     Blood pressure 118/67, pulse 89, temperature 98.4 F (36.9 C), temperature source Oral, resp. rate 20, height 5' (1.524 m), weight 114 lb 8 oz (51.937 kg), SpO2 96.00%.Body mass index is 22.36 kg/(m^2).  General Appearance: Casual and Guarded  Eye Contact::  Poor  Speech:  Slow  Volume:  Decreased  Mood:  Depressed and Hopeless  Affect:  Constricted  Thought Process:  Irrelevant  Orientation:  Full (Time, Place, and Person)  Thought Content:  Delusions, Hallucinations: Auditory Command:  Voices telling her that she is ugly and Paranoid Ideation  Suicidal Thoughts:  Yes.  with intent/plan  Homicidal Thoughts:  No  Memory:  Negative  Judgement:  Impaired  Insight:  Lacking  Psychomotor Activity:  Decreased  Concentration:  Fair  Recall:  Poor  Akathisia:  No  Handed:  Right  AIMS (if indicated):     Assets:   Housing Physical Health Social Support  Sleep:      Treatment Plan Summary: Patient requires inpatient psychiatric treatment when she is medically stable.  Continue sitter for safety.  Transfer to [facility] once  patient is medically stable for inpatient psychotic services.  Call [phone] if you have further questions.  Haig Gerardo T. 08/19/2013 4:17 PM

## 2013-08-19 NOTE — ED Notes (Signed)
Per EMS, pt from home.  Pt with spouse.  Sometime yesterday, pt drank bleach.  Pt told him that she did so.  Unknown amount.  Not witnessed.  Pt called [place] tonight b/c she stated she was vomiting up blood.  Not witnessed by spouse.  No vomiting in route.  No obvious vomiting in home.  Hx paranoia.  No prescribed meds.  Never been pscyh evaluated.  Pt is nauseated.  HX:  DM, HTN.  CBG 255.  Vitals  117/88, hr 90, spo2 98% ra

## 2013-08-19 NOTE — ED Notes (Signed)
Pt not answering questions.  Cannot determine intent with ingestion.

## 2013-08-19 NOTE — ED Provider Notes (Signed)
CSN: [id number]     Arrival date & time 08/19/13  0027 History   First MD Initiated Contact with Patient 08/19/13 [phone]     Chief Complaint  Patient presents with  . Ingestion   (Consider location/radiation/quality/duration/timing/severity/associated sxs/prior Treatment) HPI History provided by pt.   Patient has been depressed recently and In an attempt to kill herself yesterday am, patient drank a cup of clorox bleach, and took a handful of tylenol pm, lisinopril, glipizide-metformin and [place].  She does not know the exact quantities. Since then, she has had burning pain in the center of her chest and throat and hematemesis/hematochezia.  Had several episodes of vomiting.  Denies fever, cough, SOB, abd pain.  No h/o psychiatric illness or suicide attempt.  She is not anti-coagulated.  Past Medical History  Diagnosis Date  . Hypertension   . Diabetes mellitus without complication    Past Surgical History  Procedure Laterality Date  . Eye surgery    . Tubal ligation     Family History  Problem Relation Age of Onset  . Diabetes Mellitus II Mother   . Diabetes Mellitus II Father    History  Substance Use Topics  . Smoking status: Never Smoker   . Smokeless tobacco: Not on file  . Alcohol Use: No   OB History   Grav Para Term Preterm Abortions TAB SAB Ect Mult Living                 Review of Systems  All other systems reviewed and are negative.    Allergies  Review of patient's allergies indicates no known allergies.  Home Medications  No current outpatient prescriptions on file. BP 147/66  Pulse 93  Temp(Src) 98.4 F (36.9 C) (Oral)  Resp 20  Ht 5' (1.524 m)  Wt 114 lb 8 oz (51.937 kg)  BMI 22.36 kg/m2  SpO2 97% Physical Exam  Nursing note and vitals reviewed. Constitutional: She is oriented to person, place, and time. She appears well-developed and well-nourished. No distress.  HENT:  Head: Normocephalic and atraumatic.  Mouth/Throat: Oropharynx is clear  and moist.  Eyes:  Normal appearance  Neck: Normal range of motion.  Cardiovascular: Normal rate and regular rhythm.   Pulmonary/Chest: Effort normal and breath sounds normal. No respiratory distress. She exhibits tenderness.  Abdominal: Soft. Bowel sounds are normal. She exhibits no distension and no mass. There is no tenderness. There is no rebound and no guarding.  Genitourinary:  No CVA tenderness  Musculoskeletal: Normal range of motion.  Neurological: She is alert and oriented to person, place, and time.  Skin: Skin is warm and dry. No rash noted.  Psychiatric: She has a normal mood and affect. Her behavior is normal.    ED Course  Procedures (including critical care time) Labs Review Labs Reviewed  CBC WITH DIFFERENTIAL - Abnormal; Notable for the following:    WBC 17.5 (*)    MCHC 36.2 (*)    Neutrophils Relative % 82 (*)    Neutro Abs 14.4 (*)    Lymphocytes Relative 7 (*)    Monocytes Absolute 1.9 (*)    All other components within normal limits  COMPREHENSIVE METABOLIC PANEL - Abnormal; Notable for the following:    Sodium 122 (*)    Chloride 90 (*)    CO2 16 (*)    Glucose, Bld 276 (*)    BUN 31 (*)    GFR calc non Af Amer 64 (*)    GFR calc Af Denyse Dago  74 (*)    All other components within normal limits  SALICYLATE LEVEL - Abnormal; Notable for the following:    Salicylate Lvl <2.0 (*)    All other components within normal limits  URINALYSIS, ROUTINE W REFLEX MICROSCOPIC - Abnormal; Notable for the following:    Glucose, UA >1000 (*)    All other components within normal limits  BLOOD GAS, ARTERIAL - Abnormal; Notable for the following:    pCO2 arterial 27.9 (*)    pO2, Arterial 79.5 (*)    Bicarbonate 16.4 (*)    Acid-base deficit 7.0 (*)    All other components within normal limits  LACTIC ACID, PLASMA - Abnormal; Notable for the following:    Lactic Acid, Venous 2.8 (*)    All other components within normal limits  OCCULT BLOOD, POC DEVICE - Abnormal;  Notable for the following:    Fecal Occult Bld POSITIVE (*)    All other components within normal limits  ETHANOL  URINE RAPID DRUG SCREEN (HOSP PERFORMED)  ACETAMINOPHEN LEVEL  KETONES, QUALITATIVE  URINE MICROSCOPIC-ADD ON  TROPONIN I  SODIUM, URINE, RANDOM  OSMOLALITY, URINE  TSH  BASIC METABOLIC PANEL  BASIC METABOLIC PANEL  CBC WITH DIFFERENTIAL  TYPE AND SCREEN  ABO/RH    Date: 08/19/2013  Rate: 88  Rhythm: normal sinus rhythm  QRS Axis: normal  Intervals: normal  ST/T Wave abnormalities: normal  Conduction Disutrbances:none  Narrative Interpretation: PVCs  Old EKG Reviewed: none available   Imaging Review Dg Chest 2 View  08/19/2013   CLINICAL DATA:  Swallowed bleach.  Cough.  EXAM: CHEST  2 VIEW  COMPARISON:  None.  FINDINGS: Airspace opacity in the right middle lobe and possibly within the lingula. Heart is normal size. No effusions or acute bony abnormality.  IMPRESSION: Right middle lobe consolidation. Possible early opacity in the lingula. Findings concerning for pneumonia or pneumonitis.   Electronically Signed   By: Charlett Nose M.D.   On: 08/19/2013 02:39   Ct Chest W Contrast  08/19/2013   CLINICAL DATA:  Swallowed bleach. Vomiting blood.  EXAM: CT CHEST WITH CONTRAST  TECHNIQUE: Multidetector CT imaging of the chest was performed during intravenous contrast administration.  CONTRAST:  80mL OMNIPAQUE IOHEXOL 300 MG/ML  SOLN  COMPARISON:  CHEST x-ray earlier today.  FINDINGS: There appears to be diffuse wall thickening and surrounding haziness about the entire esophagus. This could represent changes of esophagitis related to caustic ingestion. No pneumomediastinum to suggest esophageal perforation. Heart is normal size. Scattered coronary artery calcifications. Ascending aortic diameter is slightly ectatic at 3.8 cm. No mediastinal, hilar, or axillary adenopathy.  Airspace disease noted in the right middle lobe concerning for pneumonia or pneumonitis. Dependent  atelectasis posteriorly in the lower lobes. No pleural effusions.  Chest wall soft tissues are unremarkable. No acute bony abnormality.  Imaging into the upper abdomen demonstrates edematous, indistinct gastric walls concerning for gastritis. There is free fluid in the left upper abdomen adjacent to the stomach and spleen.  IMPRESSION: Thickened, indistinct esophageal and gastric walls concerning for esophagitis and gastritis a. Adjacent scratch head free fluid adjacent to the stomach and spleen in the left upper abdomen.  Airspace disease in the right middle lobe concerning for pneumonia or pneumonitis. Dependent atelectasis in the lungs.  Scattered coronary artery calcifications.   Electronically Signed   By: Charlett Nose M.D.   On: 08/19/2013 06:13    MDM   1. Suicide attempt   2. Drug overdose, initial encounter  3. Hyponatremia   4. Pneumonitis    67yo F presents w/ CP and hematemesis/hematochezia.  Drank bleach and took a handful of lisinopril, glipizide-metformin and tylenol PM in an attempt to kill herself yesterday am.  Poison control contacted by nursing staff and they recommend hydration and monitoring.  VS w/in nml range, pt in NAD, no active vomiting, lungs congested, abd benign on exam.  Labs sig for acidosis, hyponatremia, leukocytosis, nml acetaminophen level.  CXR shows possible pneumonitis/pneumonia.  Pt received IV protonix, morphine, zofran and GI cocktail. [facility] consulted for admission and request ABG, acetone level, CT chest w/ contrast.      Otilio Miu, PA-C 08/19/13 0631  Otilio Miu, PA-C 08/19/13 [phone]

## 2013-08-19 NOTE — H&P (Signed)
[facility] History and Physical  GWENDOLYNE WELFORD ZOX:[id number] DOB: 04-Mar-1946 DOA: 08/19/2013  Referring physician: ER physician. PCP: No primary provider on file.   Chief Complaint: Suicide attempt. Throwing up blood.  HPI: Robin Perry is a 67 y.o. female history of diabetes mellitus type 2, hypertension was brought to the ER after patient admitted to her husband that she had taken Clorox. Patient was feeling depressed yesterday morning when she woke up and then she took 2 cups of Clorox(do not know the exact quantity) with a handful of her home medications which includes metformin/glipizide/lisinopril/Tylenol. Patient does not know exactly how many of them were therein that. Following which patient slept on the floor and Fioricet are through her blood. Later in the evening patient again threw up blood. At that point patient told her husband that she did take medications and Clorox in a suicidal attempt. Patient has been also having some chest pain since she took that. She also had some blood on moving bowels. On arrival patient had chest x-ray shows features of possible pneumonitis/pneumonia. Labs show metabolic acidosis with possible respiratory alkalosis. Patient otherwise is hemodynamically stable. Salicylates and Tylenol levels are negative. Urine drug screen is negative. Poison control was consulted. At this time they have recommended observation and if patient continues to have bleeding and needs GI evaluation for any persistent bleed from the Clorox. Patient since still has chest pain CT chest is pending. Cardiac markers also pending. As per husband patient has been seeing things recently that patient feels is trying to harm her.  Review of Systems: As presented in the history of presenting illness, rest negative.  Past Medical History  Diagnosis Date  . Hypertension   . Diabetes mellitus without complication    Past Surgical History  Procedure Laterality Date  . Eye surgery    . Tubal  ligation     Social History:  reports that she has never smoked. She does not have any smokeless tobacco history on file. She reports that she does not drink alcohol or use illicit drugs. Home. where does patient live-- Can do ADLs. Can patient participate in ADLs?  No Known Allergies  Family History  Problem Relation Age of Onset  . Diabetes Mellitus II Mother   . Diabetes Mellitus II Father       Prior to Admission medications   Medication Sig Start Date End Date Taking? Authorizing Provider  brimonidine (ALPHAGAN) 0.2 % ophthalmic solution Place 1 drop into both eyes 2 (two) times daily.   Yes Historical Provider, MD  CALCIUM-VITAMIN D PO Take 1 tablet by mouth daily.   Yes Historical Provider, MD  dorzolamide-timolol (COSOPT) 22.3-6.8 MG/ML ophthalmic solution Place 1 drop into both eyes 2 (two) times daily.   Yes Historical Provider, MD  glipiZIDE-metformin (METAGLIP) 5-500 MG per tablet Take 1 tablet by mouth 2 (two) times daily before a meal.   Yes Historical Provider, MD  lisinopril (PRINIVIL,ZESTRIL) 10 MG tablet Take 10 mg by mouth daily.   Yes Historical Provider, MD  ranitidine (ZANTAC) 150 MG capsule Take 150 mg by mouth 2 (two) times daily as needed for heartburn.   Yes Historical Provider, MD  sitaGLIPtin (JANUVIA) 100 MG tablet Take 100 mg by mouth daily.   Yes Historical Provider, MD  triamcinolone cream (KENALOG) 0.1 % Apply 1 application topically 2 (two) times daily as needed (rash).   Yes Historical Provider, MD   Physical Exam: Filed Vitals:   08/19/13 0100 08/19/13 0430 08/19/13 0503  BP: 148/77 144/76 128/66  Pulse: 86 93 91  Temp: 98.1 F (36.7 C)    TempSrc: Oral    Resp: 18 18 18   SpO2: 95% 93% 96%     General:  Well-developed and nourished.  Eyes: Anicteric no pallor.  ENT: No discharge from the ears eyes nose mouth.  Neck: No mass felt.  Cardiovascular: S1-S2 heard.  Respiratory: Mild expiratory wheeze no crepitations.  Abdomen: Soft  nontender bowel sounds present.  Skin: No rash.  Musculoskeletal: No edema.  Psychiatric: Suicidal.  Neurologic: Alert awake oriented to time place and person. Moves all extremities.  Labs on Admission:  Basic Metabolic Panel:  Recent Labs Lab 08/19/13 0145  NA 122*  K 4.0  CL 90*  CO2 16*  GLUCOSE 276*  BUN 31*  CREATININE 0.92  CALCIUM 8.9   Liver Function Tests:  Recent Labs Lab 08/19/13 0145  AST 36  ALT 16  ALKPHOS 53  BILITOT 0.9  PROT 6.1  ALBUMIN 3.5   No results found for this basename: LIPASE, AMYLASE,  in the last 168 hours No results found for this basename: AMMONIA,  in the last 168 hours CBC:  Recent Labs Lab 08/19/13 0145  WBC 17.5*  NEUTROABS 14.4*  HGB 14.4  HCT 39.8  MCV 87.5  PLT 187   Cardiac Enzymes: No results found for this basename: CKTOTAL, CKMB, CKMBINDEX, TROPONINI,  in the last 168 hours  BNP (last 3 results) No results found for this basename: PROBNP,  in the last 8760 hours CBG: No results found for this basename: GLUCAP,  in the last 168 hours  Radiological Exams on Admission: Dg Chest 2 View  08/19/2013   CLINICAL DATA:  Swallowed bleach.  Cough.  EXAM: CHEST  2 VIEW  COMPARISON:  None.  FINDINGS: Airspace opacity in the right middle lobe and possibly within the lingula. Heart is normal size. No effusions or acute bony abnormality.  IMPRESSION: Right middle lobe consolidation. Possible early opacity in the lingula. Findings concerning for pneumonia or pneumonitis.   Electronically Signed   By: Charlett Nose M.D.   On: 08/19/2013 02:39    EKG: Independently reviewed. Normal sinus rhythm with PVCs.  Assessment/Plan Principal Problem:   Suicide attempt Active Problems:   Diabetes mellitus   HTN (hypertension)   Pneumonitis   1. Suicidal attempt with multiple drugs - suicide precautions with sitter. Once medically stable consult psychiatrist. Patient will be hydrated and closely follow CBC and metabolic panel. Seen  #2. 2. GI bleed - probably secondary to Clorox ingestion. Keep patient n.p.o. Protonix IV. Closely follow CBC. Type and screen. Consult GI. 3. Possible pneumonitis/pneumonia - CT chest is pending. Patient has been placed on clindamycin for possible aspiration pneumonitis. When necessary albuterol. 4. Severe hyponatremia - at this time and gently hydrating. Closely follow metabolic panel. Check urine sodium and osmolality and check TSH. 5. Chest pain - probably secondary to Clorox ingestion. CT chest is pending. Troponins are pending. 6. Diabetes mellitus type 2 - presently patient is being placed on sliding-scale coverage as patient is n.p.o. Patient has taken glipizide and may have late effect hyperglycemia. Closely follow CBGs. 7. History of hypertension - patient did take lisinopril also yesterday. Closely observe vital signs.    Code Status: Full code.  Family Communication: Patient's husband at the bedside.  Disposition Plan: Admit to inpatient.    Joe Tanney N. [facility] Pager [phone].  If 7PM-7AM, please contact night-coverage [email or web address] Password [facility] 08/19/2013, 5:36 AM

## 2013-08-19 NOTE — Consult Note (Addendum)
Reason for Consult: Suicide attempt-Clorox ingestion-vomiting blood. Referring Physician: THP  Robin Perry is an 67 y.o. female.  HPI: Robin Perry is a very pleasant [place] female, that has been coming to my practice for several years now. She had a colonoscopy in 02/2013 when multiple polyps were removed. She claims she was in her usual state of health till about 6:30 AM yesterday, when she drank a cup full of Clorox along with several of her daily pills and shortly thereafter started vomiting blood when she tried or eat or drink anything. She did not tell her husband till about 10 pm last night, when EMS nShe claims she has been somewhat depressed lately due to stresses caused by her relationship with a friend. I do not understand her very well so I may not be totally correct in my assessment. She now has not been able to eat or drink ant fluids without having severe chest pain and burning. She is also wheezing and a CXR on admission reveals a pneumonitis which probably a "chemical pneumonitis". CT on admission revealed thickening of the esophageal and gastric walls consistent with esophagitis and gastritis.  Past Medical History  Diagnosis Date  . Hypertension   . Diabetes mellitus without complication    Past Surgical History  Procedure Laterality Date  . Eye surgery    . Tubal ligation     Family History  Problem Relation Age of Onset  . Diabetes Mellitus II Mother   . Diabetes Mellitus II Father    Social History:  reports that she has never smoked. She does not have any smokeless tobacco history on file. She reports that she does not drink alcohol or use illicit drugs.  Allergies: No Known Allergies  Medications: I have reviewed the patient's current medications.  Results for orders placed during the hospital encounter of 08/19/13 (from the past 48 hour(s))  CBC WITH DIFFERENTIAL     Status: Abnormal   Collection Time    08/19/13  1:45 AM      Result Value Range   WBC 17.5 (*) 4.0  - 10.5 K/uL   RBC 4.55  3.87 - 5.11 MIL/uL   Hemoglobin 14.4  12.0 - 15.0 g/dL   HCT 30.8  65.7 - 84.6 %   MCV 87.5  78.0 - 100.0 fL   MCH 31.6  26.0 - 34.0 pg   MCHC 36.2 (*) 30.0 - 36.0 g/dL   RDW 96.2  95.2 - 84.1 %   Platelets 187  150 - 400 K/uL   Neutrophils Relative % 82 (*) 43 - 77 %   Neutro Abs 14.4 (*) 1.7 - 7.7 K/uL   Lymphocytes Relative 7 (*) 12 - 46 %   Lymphs Abs 1.2  0.7 - 4.0 K/uL   Monocytes Relative 11  3 - 12 %   Monocytes Absolute 1.9 (*) 0.1 - 1.0 K/uL   Eosinophils Relative 0  0 - 5 %   Eosinophils Absolute 0.0  0.0 - 0.7 K/uL   Basophils Relative 0  0 - 1 %   Basophils Absolute 0.0  0.0 - 0.1 K/uL  COMPREHENSIVE METABOLIC PANEL     Status: Abnormal   Collection Time    08/19/13  1:45 AM      Result Value Range   Sodium 122 (*) 135 - 145 mEq/L   Potassium 4.0  3.5 - 5.1 mEq/L   Chloride 90 (*) 96 - 112 mEq/L   CO2 16 (*) 19 - 32 mEq/L  Glucose, Bld 276 (*) 70 - 99 mg/dL   BUN 31 (*) 6 - 23 mg/dL   Creatinine, Ser 1.19  0.50 - 1.10 mg/dL   Calcium 8.9  8.4 - 14.7 mg/dL   Total Protein 6.1  6.0 - 8.3 g/dL   Albumin 3.5  3.5 - 5.2 g/dL   AST 36  0 - 37 U/L   ALT 16  0 - 35 U/L   Alkaline Phosphatase 53  39 - 117 U/L   Total Bilirubin 0.9  0.3 - 1.2 mg/dL   GFR calc non Af Amer 64 (*) >90 mL/min   GFR calc Af Amer 74 (*) >90 mL/min   Comment: (NOTE)     The eGFR has been calculated using the CKD EPI equation.     This calculation has not been validated in all clinical situations.     eGFR's persistently <90 mL/min signify possible Chronic Kidney     Disease.  ETHANOL     Status: None   Collection Time    08/19/13  1:45 AM      Result Value Range   Alcohol, Ethyl (B) <11  0 - 11 mg/dL   Comment:            LOWEST DETECTABLE LIMIT FOR     SERUM ALCOHOL IS 11 mg/dL     FOR MEDICAL PURPOSES ONLY     Performed at [facility]  SALICYLATE LEVEL     Status: Abnormal   Collection Time    08/19/13  1:45 AM      Result Value Range    Salicylate Lvl <2.0 (*) 2.8 - 20.0 mg/dL  ACETAMINOPHEN LEVEL     Status: None   Collection Time    08/19/13  1:45 AM      Result Value Range   Acetaminophen (Tylenol), Serum <15.0  10 - 30 ug/mL   Comment:            THERAPEUTIC CONCENTRATIONS VARY     SIGNIFICANTLY. A RANGE OF 10-30     ug/mL MAY BE AN EFFECTIVE     CONCENTRATION FOR MANY PATIENTS.     HOWEVER, SOME ARE BEST TREATED     AT CONCENTRATIONS OUTSIDE THIS     RANGE.     ACETAMINOPHEN CONCENTRATIONS     >150 ug/mL AT 4 HOURS AFTER     INGESTION AND >50 ug/mL AT 12     HOURS AFTER INGESTION ARE     OFTEN ASSOCIATED WITH TOXIC     REACTIONS.  OCCULT BLOOD, POC DEVICE     Status: Abnormal   Collection Time    08/19/13  2:16 AM      Result Value Range   Fecal Occult Bld POSITIVE (*) NEGATIVE  URINE RAPID DRUG SCREEN (HOSP PERFORMED)     Status: None   Collection Time    08/19/13  4:01 AM      Result Value Range   Opiates NONE DETECTED  NONE DETECTED   Cocaine NONE DETECTED  NONE DETECTED   Benzodiazepines NONE DETECTED  NONE DETECTED   Amphetamines NONE DETECTED  NONE DETECTED   Tetrahydrocannabinol NONE DETECTED  NONE DETECTED   Barbiturates NONE DETECTED  NONE DETECTED   Comment:            DRUG SCREEN FOR MEDICAL PURPOSES     ONLY.  IF CONFIRMATION IS NEEDED     FOR ANY PURPOSE, NOTIFY LAB     WITHIN 5 DAYS.  LOWEST DETECTABLE LIMITS     FOR URINE DRUG SCREEN     Drug Class       Cutoff (ng/mL)     Amphetamine      1000     Barbiturate      200     Benzodiazepine   200     Tricyclics       300     Opiates          300     Cocaine          300     THC              50  URINALYSIS, ROUTINE W REFLEX MICROSCOPIC     Status: Abnormal   Collection Time    08/19/13  4:01 AM      Result Value Range   Color, Urine YELLOW  YELLOW   APPearance CLEAR  CLEAR   Specific Gravity, Urine 1.025  1.005 - 1.030   pH 5.5  5.0 - 8.0   Glucose, UA >1000 (*) NEGATIVE mg/dL   Hgb urine dipstick NEGATIVE   NEGATIVE   Bilirubin Urine NEGATIVE  NEGATIVE   Ketones, ur NEGATIVE  NEGATIVE mg/dL   Protein, ur NEGATIVE  NEGATIVE mg/dL   Urobilinogen, UA 0.2  0.0 - 1.0 mg/dL   Nitrite NEGATIVE  NEGATIVE   Leukocytes, UA NEGATIVE  NEGATIVE  URINE MICROSCOPIC-ADD ON     Status: None   Collection Time    08/19/13  4:01 AM      Result Value Range   Squamous Epithelial / LPF RARE  RARE   WBC, UA 0-2  <3 WBC/hpf  BLOOD GAS, ARTERIAL     Status: Abnormal   Collection Time    08/19/13  4:27 AM      Result Value Range   FIO2 0.21     pH, Arterial 7.386  7.350 - 7.450   pCO2 arterial 27.9 (*) 35.0 - 45.0 mmHg   pO2, Arterial 79.5 (*) 80.0 - 100.0 mmHg   Bicarbonate 16.4 (*) 20.0 - 24.0 mEq/L   TCO2 14.7  0 - 100 mmol/L   Acid-base deficit 7.0 (*) 0.0 - 2.0 mmol/L   O2 Saturation 95.5     Patient temperature 98.6     Collection site LEFT RADIAL     Drawn by [id number]     Sample type ARTERIAL DRAW     Allens test (pass/fail) PASS  PASS  KETONES, QUALITATIVE     Status: None   Collection Time    08/19/13  4:35 AM      Result Value Range   Acetone, Bld NEGATIVE  NEGATIVE  TYPE AND SCREEN     Status: None   Collection Time    08/19/13  4:35 AM      Result Value Range   ABO/RH(D) O POS     Antibody Screen NEG     Sample Expiration 08/22/2013    ABO/RH     Status: None   Collection Time    08/19/13  4:35 AM      Result Value Range   ABO/RH(D) O POS    LACTIC ACID, PLASMA     Status: Abnormal   Collection Time    08/19/13  4:50 AM      Result Value Range   Lactic Acid, Venous 2.8 (*) 0.5 - 2.2 mmol/L  TROPONIN I     Status: None   Collection Time    08/19/13  4:50 AM      Result Value Range   Troponin I <0.30  <0.30 ng/mL   Comment:            Due to the release kinetics of cTnI,     a negative result within the first hours     of the onset of symptoms does not rule out     myocardial infarction with certainty.     If myocardial infarction is still suspected,     repeat the test at  appropriate intervals.  GLUCOSE, CAPILLARY     Status: Abnormal   Collection Time    08/19/13  6:42 AM      Result Value Range   Glucose-Capillary 189 (*) 70 - 99 mg/dL   Comment 1 Documented in Chart     Comment 2 Notify RN    TSH     Status: None   Collection Time    08/19/13  6:45 AM      Result Value Range   TSH 1.754  0.350 - 4.500 uIU/mL   Comment: Performed at [facility]  CBC WITH DIFFERENTIAL     Status: Abnormal   Collection Time    08/19/13  6:45 AM      Result Value Range   WBC 10.5  4.0 - 10.5 K/uL   RBC 4.12  3.87 - 5.11 MIL/uL   Hemoglobin 12.9  12.0 - 15.0 g/dL   HCT 16.1  09.6 - 04.5 %   MCV 87.6  78.0 - 100.0 fL   MCH 31.3  26.0 - 34.0 pg   MCHC 35.7  30.0 - 36.0 g/dL   RDW 40.9  81.1 - 91.4 %   Platelets 150  150 - 400 K/uL   Neutrophils Relative % 76  43 - 77 %   Neutro Abs 8.0 (*) 1.7 - 7.7 K/uL   Lymphocytes Relative 12  12 - 46 %   Lymphs Abs 1.3  0.7 - 4.0 K/uL   Monocytes Relative 12  3 - 12 %   Monocytes Absolute 1.2 (*) 0.1 - 1.0 K/uL   Eosinophils Relative 0  0 - 5 %   Eosinophils Absolute 0.0  0.0 - 0.7 K/uL   Basophils Relative 0  0 - 1 %   Basophils Absolute 0.0  0.0 - 0.1 K/uL  GLUCOSE, CAPILLARY     Status: Abnormal   Collection Time    08/19/13  8:16 AM      Result Value Range   Glucose-Capillary 215 (*) 70 - 99 mg/dL  SODIUM, URINE, RANDOM     Status: None   Collection Time    08/19/13  9:16 AM      Result Value Range   Sodium, Ur 130     Comment: Performed at [facility]  OSMOLALITY, URINE     Status: None   Collection Time    08/19/13  9:16 AM      Result Value Range   Osmolality, Ur 625  390 - 1090 mOsm/kg   Comment: Performed at [facility]  BASIC METABOLIC PANEL     Status: Abnormal   Collection Time    08/19/13 10:45 AM      Result Value Range   Sodium 126 (*) 135 - 145 mEq/L   Potassium 3.5  3.5 - 5.1 mEq/L   Chloride 94 (*) 96 - 112 mEq/L   CO2 22  19 - 32 mEq/L   Glucose, Bld 209 (*) 70  -  99 mg/dL   BUN 20  6 - 23 mg/dL   Comment: REPEATED TO VERIFY     DELTA CHECK NOTED   Creatinine, Ser 0.72  0.50 - 1.10 mg/dL   Calcium 8.0 (*) 8.4 - 10.5 mg/dL   GFR calc non Af Amer 88 (*) >90 mL/min   GFR calc Af Amer >90  >90 mL/min   Comment: (NOTE)     The eGFR has been calculated using the CKD EPI equation.     This calculation has not been validated in all clinical situations.     eGFR's persistently <90 mL/min signify possible Chronic Kidney     Disease.  GLUCOSE, CAPILLARY     Status: Abnormal   Collection Time    08/19/13 11:50 AM      Result Value Range   Glucose-Capillary 185 (*) 70 - 99 mg/dL  BASIC METABOLIC PANEL     Status: Abnormal   Collection Time    08/19/13  1:46 PM      Result Value Range   Sodium 127 (*) 135 - 145 mEq/L   Potassium 3.6  3.5 - 5.1 mEq/L   Chloride 95 (*) 96 - 112 mEq/L   CO2 21  19 - 32 mEq/L   Glucose, Bld 144 (*) 70 - 99 mg/dL   BUN 18  6 - 23 mg/dL   Creatinine, Ser 4.54  0.50 - 1.10 mg/dL   Calcium 8.1 (*) 8.4 - 10.5 mg/dL   GFR calc non Af Amer >90  >90 mL/min   GFR calc Af Amer >90  >90 mL/min   Comment: (NOTE)     The eGFR has been calculated using the CKD EPI equation.     This calculation has not been validated in all clinical situations.     eGFR's persistently <90 mL/min signify possible Chronic Kidney     Disease.  GLUCOSE, CAPILLARY     Status: Abnormal   Collection Time    08/19/13  4:56 PM      Result Value Range   Glucose-Capillary 166 (*) 70 - 99 mg/dL   Dg Chest 2 View  07/27/1190   CLINICAL DATA:  Swallowed bleach.  Cough.  EXAM: CHEST  2 VIEW  COMPARISON:  None.  FINDINGS: Airspace opacity in the right middle lobe and possibly within the lingula. Heart is normal size. No effusions or acute bony abnormality.  IMPRESSION: Right middle lobe consolidation. Possible early opacity in the lingula. Findings concerning for pneumonia or pneumonitis.   Electronically Signed   By: Charlett Nose M.D.   On: 08/19/2013 02:39    Ct Chest W Contrast  08/19/2013   CLINICAL DATA:  Swallowed bleach. Vomiting blood.  EXAM: CT CHEST WITH CONTRAST  TECHNIQUE: Multidetector CT imaging of the chest was performed during intravenous contrast administration.  CONTRAST:  80mL OMNIPAQUE IOHEXOL 300 MG/ML  SOLN  COMPARISON:  CHEST x-ray earlier today.  FINDINGS: There appears to be diffuse wall thickening and surrounding haziness about the entire esophagus. This could represent changes of esophagitis related to caustic ingestion. No pneumomediastinum to suggest esophageal perforation. Heart is normal size. Scattered coronary artery calcifications. Ascending aortic diameter is slightly ectatic at 3.8 cm. No mediastinal, hilar, or axillary adenopathy.  Airspace disease noted in the right middle lobe concerning for pneumonia or pneumonitis. Dependent atelectasis posteriorly in the lower lobes. No pleural effusions.  Chest wall soft tissues are unremarkable. No acute bony abnormality.  Imaging into the upper abdomen demonstrates edematous, indistinct gastric walls  concerning for gastritis. There is free fluid in the left upper abdomen adjacent to the stomach and spleen.  IMPRESSION: Thickened, indistinct esophageal and gastric walls concerning for esophagitis and gastritis a. Adjacent scratch head free fluid adjacent to the stomach and spleen in the left upper abdomen.  Airspace disease in the right middle lobe concerning for pneumonia or pneumonitis. Dependent atelectasis in the lungs.  Scattered coronary artery calcifications.   Electronically Signed   By: Charlett Nose M.D.   On: 08/19/2013 06:13   Review of Systems  Constitutional: Positive for malaise/fatigue. Negative for fever, chills, weight loss and diaphoresis.  HENT: Negative.   Eyes: Negative.   Respiratory: Positive for cough, hemoptysis, sputum production and wheezing.   Cardiovascular: Positive for chest pain. Negative for palpitations.  Gastrointestinal: Positive for heartburn,  nausea, vomiting, abdominal pain and diarrhea. Negative for constipation, blood in stool and melena.  Genitourinary: Negative.   Musculoskeletal: Negative.   Skin: Negative.   Neurological: Positive for weakness.  Psychiatric/Behavioral: Positive for depression and suicidal ideas. Negative for hallucinations, memory loss and substance abuse. The patient is nervous/anxious and has insomnia.    Blood pressure 118/67, pulse 89, temperature 98.4 F (36.9 C), temperature source Oral, resp. rate 20, height 5' (1.524 m), weight 51.937 kg (114 lb 8 oz), SpO2 96.00%. Physical Exam  Constitutional: She is oriented to person, place, and time. She appears well-developed and well-nourished.  HENT:  Head: Normocephalic and atraumatic.  No oral lesions  Eyes: Conjunctivae and EOM are normal. Pupils are equal, round, and reactive to light.  Neck: Normal range of motion. Neck supple.  Cardiovascular: Normal rate and regular rhythm.   Respiratory: She has wheezes. She exhibits tenderness.  GI: Bowel sounds are normal. She exhibits no distension and no mass. There is no tenderness. There is no rebound and no guarding.  Musculoskeletal: Normal range of motion.  Neurological: She is alert and oriented to person, place, and time.  Skin: Skin is warm and dry.  Psychiatric:  Seems depresed and dejected   Assessment/Plan: 1) Hemetemesis/severe esophagitis/gastritis after caustic ingestion-suicide attempt: As per my discussion with Dr. Pecolia Ades, her CT scan shows severe esophageal and gastric inflammation with free fluid in the LUQ. I think it would be dangerous to do an EGD at this time. Therefore will start Carafate suspension and follow her for a few days before attempting an EGD. Will allow her ice chips and clears for now. 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 3) HTN on Lisinopril at home.  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   Taylor Spilde 08/19/2013, 6:38 PM

## 2013-08-19 NOTE — ED Notes (Signed)
Poison control called and spoke with Onalee Hua, they suggest NPO for 2 hours then a fluid challenge, if she vomits after a fluid challenge then NPO again, vital signs are stable.

## 2013-08-19 NOTE — Progress Notes (Signed)
Dellia Nims, RN from [facility] called to follow up on the patient. Updated her on patient's current status, complaints, lab values and vital signs. Stanton Kidney R

## 2013-08-19 NOTE — ED Notes (Signed)
MD at bedside. 

## 2013-08-20 DIAGNOSIS — I10 Essential (primary) hypertension: Secondary | ICD-10-CM

## 2013-08-20 DIAGNOSIS — E119 Type 2 diabetes mellitus without complications: Secondary | ICD-10-CM

## 2013-08-20 LAB — CBC
HCT: 29.1 % — ABNORMAL LOW (ref 36.0–46.0)
Hemoglobin: 10.3 g/dL — ABNORMAL LOW (ref 12.0–15.0)
MCH: 30.9 pg (ref 26.0–34.0)
MCHC: 35.4 g/dL (ref 30.0–36.0)
Platelets: 120 10*3/uL — ABNORMAL LOW (ref 150–400)
Platelets: 135 10*3/uL — ABNORMAL LOW (ref 150–400)
RDW: 13.1 % (ref 11.5–15.5)
RDW: 13.2 % (ref 11.5–15.5)
WBC: 13.3 10*3/uL — ABNORMAL HIGH (ref 4.0–10.5)
WBC: 14.5 10*3/uL — ABNORMAL HIGH (ref 4.0–10.5)

## 2013-08-20 LAB — GLUCOSE, CAPILLARY
Glucose-Capillary: 183 mg/dL — ABNORMAL HIGH (ref 70–99)
Glucose-Capillary: 190 mg/dL — ABNORMAL HIGH (ref 70–99)
Glucose-Capillary: 200 mg/dL — ABNORMAL HIGH (ref 70–99)

## 2013-08-20 LAB — COMPREHENSIVE METABOLIC PANEL
ALT: 10 U/L (ref 0–35)
AST: 20 U/L (ref 0–37)
BUN: 14 mg/dL (ref 6–23)
CO2: 22 mEq/L (ref 19–32)
Calcium: 7.7 mg/dL — ABNORMAL LOW (ref 8.4–10.5)
GFR calc Af Amer: >90 mL/min (ref >90)
GFR calc non Af Amer: >90 mL/min (ref >90)
Glucose, Bld: 196 mg/dL — ABNORMAL HIGH (ref 70–99)
Potassium: 3.2 mEq/L — ABNORMAL LOW (ref 3.5–5.1)
Sodium: 125 mEq/L — ABNORMAL LOW (ref 135–145)
Total Bilirubin: 0.9 mg/dL (ref 0.3–1.2)

## 2013-08-20 MED ORDER — POTASSIUM CHLORIDE 10 MEQ/100ML IV SOLN
10.0000 meq | INTRAVENOUS | Status: DC
  Filled 2013-08-20: qty 100

## 2013-08-20 MED ORDER — POTASSIUM CHLORIDE 10 MEQ/100ML IV SOLN
10.0000 meq | INTRAVENOUS | Status: AC
  Administered 2013-08-20 (×2): 10 meq via INTRAVENOUS
  Filled 2013-08-20 (×2): qty 100

## 2013-08-20 MED ORDER — SODIUM CHLORIDE 0.9 % IV SOLN
INTRAVENOUS | Status: DC
  Administered 2013-08-20 – 2013-08-21 (×2): via INTRAVENOUS

## 2013-08-20 MED ORDER — GI COCKTAIL ~~LOC~~
30.0000 mL | Freq: Two times a day (BID) | ORAL | Status: DC | PRN
Start: 2013-08-20 — End: 2013-08-25
  Administered 2013-08-20 – 2013-08-24 (×2): 30 mL via ORAL
  Filled 2013-08-20 (×3): qty 30

## 2013-08-20 MED ORDER — MORPHINE SULFATE 2 MG/ML IJ SOLN
1.0000 mg | INTRAMUSCULAR | Status: DC | PRN
  Administered 2013-08-20 – 2013-08-22 (×5): 1 mg via INTRAVENOUS
  Filled 2013-08-20 (×5): qty 1

## 2013-08-20 NOTE — ED Provider Notes (Signed)
Medical screening examination/treatment/procedure(s) were performed by non-physician practitioner and as supervising physician I was immediately available for consultation/collaboration.  Marta Bouie, MD 08/20/13 0040 

## 2013-08-20 NOTE — Progress Notes (Signed)
[facility] PROGRESS NOTE  Robin Perry VHQ:[id number] DOB: 02/12/1946 DOA: 08/19/2013 PCP: No primary provider on file. bRIEF HPI: Robin Perry is a 67 y.o. female history of diabetes mellitus type 2, hypertension was brought to the ER after patient admitted to her husband that she had taken Clorox. Patient was feeling depressed yesterday morning when she woke up and then she took 2 cups of Clorox(do not know the exact quantity) with a handful of her home medications which includes metformin/glipizide/lisinopril/Tylenol. Patient otherwise is hemodynamically stable. Salicylates and Tylenol levels are negative. Urine drug screen is negative. Poison control was consulted. At this time they have recommended observation and if patient continues to have bleeding and needs GI evaluation for any persistent bleed from the Clorox. CT CHEST shows Thickened, indistinct esophageal and gastric walls concerning for esophagitis and gastritis a. Adjacent scratch head free fluid adjacent to the stomach and spleen in the left upper abdomen. Airspace disease in the right middle lobe concerning for pneumonia  or pneumonitis. Dependent atelectasis in the lungs. GI consulted, and she was started on IV protonix and IV clindamycin.     Assessment/Plan:  Suicidal attempt with multiple drugs - suicide precautions with sitter.  Patient will be hydrated and closely follow CBC and metabolic panel. Psychiatry consulted.    #2. GI bleed - probably secondary to Clorox ingestion.  Protonix IV. Closely follow CBC. Type and screen. Consulted GI. Stable H&H. On clears.   #3 Possible pneumonitis/pneumonia - CT chest is done and showing pneumonitis.  Patient has been placed on clindamycin for possible aspiration pneumonitis. When necessary albuterol.   #4 Severe hyponatremia - at this time and gently hydrating. Closely follow metabolic panel. Check urine sodium and osmolality and check TSH.   #5 Chest pain - probably secondary to  Clorox ingestion.  Troponin negative.  #6 Diabetes mellitus type 2 - presently patient is being placed on sliding-scale coverage as patient is n.p.o. Patient has taken glipizide and may have late effect hyperglycemia. Closely follow CBGs.  #7History of hypertension - patient did take lisinopril also yesterday. Closely observe vital signs.   Code Status: full code Family Communication: family at bedside Disposition Plan: pending.    Consultants:  GI CONSULT from Dr Loreta Ave  Psychiatry consult  Procedures:  None so far  Antibiotics:  IV clindamycin.   HPI/Subjective:  sub sternal burning sensation improving.   Objective: Filed Vitals:   08/20/13 1045  BP: 97/58  Pulse: 88  Temp: 98.8 F (37.1 C)  Resp: 18    Intake/Output Summary (Last 24 hours) at 08/20/13 1851 Last data filed at 08/20/13 1724  Gross per 24 hour  Intake 756.67 ml  Output      0 ml  Net 756.67 ml   Filed Weights   08/19/13 0607 08/20/13 0124  Weight: 51.937 kg (114 lb 8 oz) 50.8 kg (111 lb 15.9 oz)    Exam:   General:  Alert afebrile comfortable  Cardiovascular: s1s2  Respiratory: scattered rhonchi  Abdomen: soft MILDLY tender in the epigastric area  Musculoskeletal: no pedal edema.   Data Reviewed: Basic Metabolic Panel:  Recent Labs Lab 08/19/13 0145 08/19/13 1045 08/19/13 1346 08/20/13 0435  NA 122* 126* 127* 125*  K 4.0 3.5 3.6 3.2*  CL 90* 94* 95* 93*  CO2 16* 22 21 22   GLUCOSE 276* 209* 144* 196*  BUN 31* 20 18 14   CREATININE 0.92 0.72 0.58 0.54  CALCIUM 8.9 8.0* 8.1* 7.7*   Liver Function Tests:  Recent Labs Lab 08/19/13 0145 08/20/13 0435  AST 36 20  ALT 16 10  ALKPHOS 53 35*  BILITOT 0.9 0.9  PROT 6.1 4.9*  ALBUMIN 3.5 2.6*   No results found for this basename: LIPASE, AMYLASE,  in the last 168 hours No results found for this basename: AMMONIA,  in the last 168 hours CBC:  Recent Labs Lab 08/19/13 0145 08/19/13 0645 08/20/13 0435 08/20/13 0940   WBC 17.5* 10.5 13.3* 14.5*  NEUTROABS 14.4* 8.0*  --   --   HGB 14.4 12.9 10.3* 10.6*  HCT 39.8 36.1 29.1* 29.1*  MCV 87.5 87.6 87.4 87.1  PLT 187 150 120* 135*   Cardiac Enzymes:  Recent Labs Lab 08/19/13 0450  TROPONINI <0.30   BNP (last 3 results) No results found for this basename: PROBNP,  in the last 8760 hours CBG:  Recent Labs Lab 08/20/13 0029 08/20/13 0407 08/20/13 0743 08/20/13 1155 08/20/13 1646  GLUCAP 200* 183* 187* 174* 175*    No results found for this or any previous visit (from the past 240 hour(s)).   Studies: Dg Chest 2 View  08/19/2013   CLINICAL DATA:  Swallowed bleach.  Cough.  EXAM: CHEST  2 VIEW  COMPARISON:  None.  FINDINGS: Airspace opacity in the right middle lobe and possibly within the lingula. Heart is normal size. No effusions or acute bony abnormality.  IMPRESSION: Right middle lobe consolidation. Possible early opacity in the lingula. Findings concerning for pneumonia or pneumonitis.   Electronically Signed   By: Charlett Nose M.D.   On: 08/19/2013 02:39   Ct Chest W Contrast  08/19/2013   CLINICAL DATA:  Swallowed bleach. Vomiting blood.  EXAM: CT CHEST WITH CONTRAST  TECHNIQUE: Multidetector CT imaging of the chest was performed during intravenous contrast administration.  CONTRAST:  80mL OMNIPAQUE IOHEXOL 300 MG/ML  SOLN  COMPARISON:  CHEST x-ray earlier today.  FINDINGS: There appears to be diffuse wall thickening and surrounding haziness about the entire esophagus. This could represent changes of esophagitis related to caustic ingestion. No pneumomediastinum to suggest esophageal perforation. Heart is normal size. Scattered coronary artery calcifications. Ascending aortic diameter is slightly ectatic at 3.8 cm. No mediastinal, hilar, or axillary adenopathy.  Airspace disease noted in the right middle lobe concerning for pneumonia or pneumonitis. Dependent atelectasis posteriorly in the lower lobes. No pleural effusions.  Chest wall soft  tissues are unremarkable. No acute bony abnormality.  Imaging into the upper abdomen demonstrates edematous, indistinct gastric walls concerning for gastritis. There is free fluid in the left upper abdomen adjacent to the stomach and spleen.  IMPRESSION: Thickened, indistinct esophageal and gastric walls concerning for esophagitis and gastritis a. Adjacent scratch head free fluid adjacent to the stomach and spleen in the left upper abdomen.  Airspace disease in the right middle lobe concerning for pneumonia or pneumonitis. Dependent atelectasis in the lungs.  Scattered coronary artery calcifications.   Electronically Signed   By: Charlett Nose M.D.   On: 08/19/2013 06:13    Scheduled Meds: . brimonidine  1 drop Both Eyes BID  . clindamycin (CLEOCIN) IV  600 mg Intravenous Q8H  . dorzolamide-timolol  1 drop Both Eyes BID  . insulin aspart  0-9 Units Subcutaneous TID WC  . pantoprazole (PROTONIX) IV  40 mg Intravenous Q12H  . potassium chloride  10 mEq Intravenous Q1 Hr x 2  . sodium chloride  3 mL Intravenous Q12H  . sucralfate  1 g Oral QID   Continuous Infusions: .  sodium chloride 75 mL/hr at 08/20/13 1110    Principal Problem:   Suicide attempt Active Problems:   Diabetes mellitus   HTN (hypertension)   Pneumonitis    Time spent: >30 min    Maksymilian Mabey  [facility] Pager [phone] If 7PM-7AM, please contact night-coverage at [email or web address], password [facility] 08/20/2013, 6:51 PM  LOS: 1 day

## 2013-08-20 NOTE — Progress Notes (Signed)
Pt currently not experiencing suicidal ideation.

## 2013-08-20 NOTE — Progress Notes (Signed)
Clinical Social Work Department CLINICAL SOCIAL WORK PSYCHIATRY SERVICE LINE ASSESSMENT 08/20/2013  Patient:  Robin Perry  Account:  [id number]  Admit Date:  08/19/2013  Clinical Social Worker:  Unk Lightning, LCSW  Date/Time:  08/20/2013 11:30 AM Referred by:  Physician  Date referred:  08/20/2013 Reason for Referral  Psychosocial assessment   Presenting Symptoms/Problems (In the person's/family's own words):   Psych consulted due to suicide attempt.   Abuse/Neglect/Trauma History (check all that apply)  Witness to trauma   Abuse/Neglect/Trauma Comments:   Patient reports that husband was in the [employer] and was held as a prisoner of war for over 5 years. Patient reports when he was released they immediately left [place] and came to the [place].   Psychiatric History (check all that apply)  Denies history   Psychiatric medications:  None   Current Mental Health Hospitalizations/Previous Mental Health History:   Patient denies any previous MH diagnosis. Patient denies any symptoms of depression and reports she was sad just 1 day when she felt like she did not want to live anymore. Patient has had no previous treatment.   Current provider:   N/A   Place and Date:   N/A   Current Medications:   acetaminophen, acetaminophen, albuterol, hydrALAZINE, morphine injection, ondansetron (ZOFRAN) IV, ondansetron            . brimonidine  1 drop Both Eyes BID  . clindamycin (CLEOCIN) IV  600 mg Intravenous Q8H  . dorzolamide-timolol  1 drop Both Eyes BID  . insulin aspart  0-9 Units Subcutaneous TID WC  . pantoprazole (PROTONIX) IV  40 mg Intravenous Q12H  . sodium chloride  3 mL Intravenous Q12H  . sucralfate  1 g Oral QID   Previous Impatient Admission/Date/Reason:   None reported   Emotional Health / Current Symptoms    Suicide/Self Harm  Suicide attempt in past (date/description)   Suicide attempt in the past:   Patient was admitted after taking several medications and drinking  Clorox.  Patient reports this is her first attempt and denies any previous attempts and no current SI or HI.   Other harmful behavior:   None reported   Psychotic/Dissociative Symptoms  Other - See comment   Other Psychotic/Dissociative Symptoms:   Patient denies any AH or VH at first but then reports that people do not like her and often talk about her. Patient appears to be unaware that these are hallucinations but family has reported concerned for patient experiencing hallucinations.    Attention/Behavioral Symptoms  Within Normal Limits   Other Attention / Behavioral Symptoms:   Patient engaged throughout assessment.    Cognitive Impairment  Within Normal Limits   Other Cognitive Impairment:   Patient alert and oriented.    Mood and Adjustment  Mood Congruent    Stress, Anxiety, Trauma, Any Recent Loss/Stressor  None reported   Anxiety (frequency):   N/A   Phobia (specify):   N/A   Compulsive behavior (specify):   N/A   Obsessive behavior (specify):   N/A   Other:   N/A   Substance Abuse/Use  None   SBIRT completed (please refer for detailed history):  N  Self-reported substance use:   Patient denies substance use.   Urinary Drug Screen Completed:  Y Alcohol level:   <11    Environmental/Housing/Living Arrangement  Stable housing   Who is in the home:   Husband   Emergency contact:  Lanh   Financial  Medicare   Patient's Strengths  and Goals (patient's own words):   Patient has supportive family.   Clinical Social Worker's Interpretive Summary:   CSW received referral to complete psychosocial assessment. CSW reviewed chart and met with patient, brother, and son at bedside. Patient reports that family is very involved with her care and wants them present during assessment. CSW introduced myself and explained role. CSW offered to get an interpreter but patient reports she wants to talk in [place] and responded appropriately throughout  session.    Patient has been married for 42 years and has 1 son and 1 dtr. Patient came to the [place] in the [place] with her husband, brother, and son and patient's dtr was born in the [place] a few years later. Patient reports that she loves her family and they are very supportive of her. Patient worked for 31 years and retired last December. Patient reports that husband is also retired so they spent lots of time together.    Patient reports she was feeling sad and drank Clorox and took medication. Patient reports that she has not been feeling depressed and that she felt sad only on day of admission. Patient reports she was trying to kill herself but that she had felt any symptoms of depression prior to attempt. Patient reports that she has been unable to sleep but denies any further symptoms of depression. Family reports no "warning signs" of attempt and reports that patient has always been strong and independent.    Patient has never received any treatment in the past and reports that she does not need treatment now because she is not feeling suicidal. CSW explained that psych MD is recommending inpatient treatment at DC. CSW explained to patient and family about DC plans to inpatient psych at DC.    CSW and patient also spoke about coping skills and medication management. When CSW spoke about medication, patient immediately turned to brother and son to ask their opinion. Although family was agreeable to medication, patient reports she is undecided if she will take medication. Patient feels that medication is not needed because she is not usually depressed and no prior attempts.    Patient struggles with understanding illness and feels that she can DC home safely. Patient is upset with placement needed but is agreeable. CSW spoke with son individually who is agreeable to placement and reports he will encourage patient to identify the benefits of placement. Patient has appropriate affect and open to discussing  emotions with CSW. Patient is very concerned that hospital staff believes that she is crazy and that is why she is here. Patient is also concerned with cost of hospital stay. Patient alter and oriented and agreeable to treatment at this time.    CSW will make referral to inpatient psych when medically stable.   Disposition:  Recommend Psych CSW continuing to support while in hospital   [place], [place] [phone]

## 2013-08-20 NOTE — Progress Notes (Signed)
Subjective: Since I last evaluated the patient, she seems to be in a better mood today. She does NOT want to go to a psychiatric facility. She continues to have chest pain but claims it is not as severe as it was yesterday.   Objective: Vital signs in last 24 hours: Temp:  [98.7 F (37.1 C)-99.2 F (37.3 C)] 98.8 F (37.1 C) (10/02 1045) Pulse Rate:  [80-96] 88 (10/02 1045) Resp:  [16-18] 18 (10/02 1045) BP: (97-113)/(58-65) 97/58 mmHg (10/02 1045) SpO2:  [96 %-97 %] 97 % (10/02 1045) Weight:  [50.8 kg (111 lb 15.9 oz)] 50.8 kg (111 lb 15.9 oz) (10/02 0124) Last BM Date: 08/19/13  Intake/Output from previous day: 10/01 0701 - 10/02 0700 In: 1820 [P.O.:120; I.V.:1600; IV Piggyback:100] Out: 900 [Urine:900] Intake/Output this shift: Total I/O In: 290 [P.O.:240; IV Piggyback:50] Out: -   General appearance: alert, cooperative, appears stated age, flushed, no distress and pale Resp: scattered rhonchi bilaterally Cardio: regular rate and rhythm, S1, S2 normal, no murmur, click, rub or gallop GI: soft, non-tender; bowel sounds normal; no masses,  no organomegaly Extremities: extremities normal, atraumatic, no cyanosis or edema  Lab Results:  Recent Labs  08/19/13 0645 08/20/13 0435 08/20/13 0940  WBC 10.5 13.3* 14.5*  HGB 12.9 10.3* 10.6*  HCT 36.1 29.1* 29.1*  PLT 150 120* 135*   BMET  Recent Labs  08/19/13 1045 08/19/13 1346 08/20/13 0435  NA 126* 127* 125*  K 3.5 3.6 3.2*  CL 94* 95* 93*  CO2 22 21 22   GLUCOSE 209* 144* 196*  BUN 20 18 14   CREATININE 0.72 0.58 0.54  CALCIUM 8.0* 8.1* 7.7*   LFT  Recent Labs  08/20/13 0435  PROT 4.9*  ALBUMIN 2.6*  AST 20  ALT 10  ALKPHOS 35*  BILITOT 0.9   PStudies/Results: Dg Chest 2 View  08/19/2013   CLINICAL DATA:  Swallowed bleach.  Cough.  EXAM: CHEST  2 VIEW  COMPARISON:  None.  FINDINGS: Airspace opacity in the right middle lobe and possibly within the lingula. Heart is normal size. No effusions or acute  bony abnormality.  IMPRESSION: Right middle lobe consolidation. Possible early opacity in the lingula. Findings concerning for pneumonia or pneumonitis.   Electronically Signed   By: Charlett Nose M.D.   On: 08/19/2013 02:39   Ct Chest W Contrast  08/19/2013   CLINICAL DATA:  Swallowed bleach. Vomiting blood.  EXAM: CT CHEST WITH CONTRAST  TECHNIQUE: Multidetector CT imaging of the chest was performed during intravenous contrast administration.  CONTRAST:  80mL OMNIPAQUE IOHEXOL 300 MG/ML  SOLN  COMPARISON:  CHEST x-ray earlier today.  FINDINGS: There appears to be diffuse wall thickening and surrounding haziness about the entire esophagus. This could represent changes of esophagitis related to caustic ingestion. No pneumomediastinum to suggest esophageal perforation. Heart is normal size. Scattered coronary artery calcifications. Ascending aortic diameter is slightly ectatic at 3.8 cm. No mediastinal, hilar, or axillary adenopathy.  Airspace disease noted in the right middle lobe concerning for pneumonia or pneumonitis. Dependent atelectasis posteriorly in the lower lobes. No pleural effusions.  Chest wall soft tissues are unremarkable. No acute bony abnormality.  Imaging into the upper abdomen demonstrates edematous, indistinct gastric walls concerning for gastritis. There is free fluid in the left upper abdomen adjacent to the stomach and spleen.  IMPRESSION: Thickened, indistinct esophageal and gastric walls concerning for esophagitis and gastritis a. Adjacent scratch head free fluid adjacent to the stomach and spleen in the left upper abdomen.  Airspace disease in the right middle lobe concerning for pneumonia or pneumonitis. Dependent atelectasis in the lungs.  Scattered coronary artery calcifications.   Electronically Signed   By: Charlett Nose M.D.   On: 08/19/2013 06:13   Medications: I have reviewed the patient's current medications.  Assessment/Plan: 1) Severe esophagitis/pneumonitis from Clorox  ingestion: Continue present care-patient is a diabetic but is getting all clear liquids LOADED with sugar. On antibiotics for her pnemonia.  2) Hyponatremia: being followed closely.  LOS: 1 day   Robin Perry 08/20/2013, 6:59 PM

## 2013-08-21 ENCOUNTER — Inpatient Hospital Stay (HOSPITAL_COMMUNITY): Payer: Medicare Other

## 2013-08-21 LAB — GLUCOSE, CAPILLARY
Glucose-Capillary: 175 mg/dL — ABNORMAL HIGH (ref 70–99)
Glucose-Capillary: 160 mg/dL — ABNORMAL HIGH (ref 70–99)
Glucose-Capillary: 164 mg/dL — ABNORMAL HIGH (ref 70–99)
Glucose-Capillary: 171 mg/dL — ABNORMAL HIGH (ref 70–99)

## 2013-08-21 LAB — CBC
Hemoglobin: 10.2 g/dL — ABNORMAL LOW (ref 12.0–15.0)
MCH: 31.7 pg (ref 26.0–34.0)
MCHC: 36.2 g/dL — ABNORMAL HIGH (ref 30.0–36.0)
Platelets: 140 10*3/uL — ABNORMAL LOW (ref 150–400)
RDW: 13.2 % (ref 11.5–15.5)
WBC: 17.9 10*3/uL — ABNORMAL HIGH (ref 4.0–10.5)

## 2013-08-21 LAB — BASIC METABOLIC PANEL
BUN: 7 mg/dL (ref 6–23)
Calcium: 8 mg/dL — ABNORMAL LOW (ref 8.4–10.5)
Chloride: 95 mEq/L — ABNORMAL LOW (ref 96–112)
Creatinine, Ser: 0.47 mg/dL — ABNORMAL LOW (ref 0.50–1.10)
GFR calc Af Amer: >90 mL/min (ref >90)
GFR calc non Af Amer: >90 mL/min (ref >90)
Glucose, Bld: 174 mg/dL — ABNORMAL HIGH (ref 70–99)
Potassium: 3.3 mEq/L — ABNORMAL LOW (ref 3.5–5.1)

## 2013-08-21 MED ORDER — GUAIFENESIN 100 MG/5ML PO SOLN
5.0000 mL | ORAL | Status: DC | PRN
  Administered 2013-08-21 – 2013-08-25 (×10): 100 mg via ORAL
  Filled 2013-08-21 (×10): qty 10

## 2013-08-21 MED ORDER — ALBUTEROL SULFATE (5 MG/ML) 0.5% IN NEBU
2.5000 mg | INHALATION_SOLUTION | Freq: Four times a day (QID) | RESPIRATORY_TRACT | Status: DC
  Administered 2013-08-21 – 2013-08-24 (×9): 2.5 mg via RESPIRATORY_TRACT
  Filled 2013-08-21 (×10): qty 0.5

## 2013-08-21 NOTE — Progress Notes (Signed)
Subjective: Feeling well.  Objective: Vital signs in last 24 hours: Temp:  [98 F (36.7 C)-99.1 F (37.3 C)] 99.1 F (37.3 C) (10/03 1343) Pulse Rate:  [80-87] 80 (10/03 1343) Resp:  [18-20] 20 (10/03 1343) BP: (102-128)/(66-77) 128/68 mmHg (10/03 1343) SpO2:  [94 %-95 %] 95 % (10/03 1343) Weight:  [106 lb 7.7 oz (48.3 kg)] 106 lb 7.7 oz (48.3 kg) (10/03 0514) Last BM Date: 08/20/13  Intake/Output from previous day: 10/02 0701 - 10/03 0700 In: 1913.8 [P.O.:240; I.V.:1473.8; IV Piggyback:200] Out: -  Intake/Output this shift: Total I/O In: 407.5 [P.O.:120; I.V.:287.5] Out: -   General appearance: alert and no distress GI: soft, non-tender; bowel sounds normal; no masses,  no organomegaly  Lab Results:  Recent Labs  08/20/13 0435 08/20/13 0940 08/21/13 1105  WBC 13.3* 14.5* 17.9*  HGB 10.3* 10.6* 10.2*  HCT 29.1* 29.1* 28.2*  PLT 120* 135* 140*   BMET  Recent Labs  08/19/13 1346 08/20/13 0435 08/21/13 1105  NA 127* 125* 130*  K 3.6 3.2* 3.3*  CL 95* 93* 95*  CO2 21 22 22   GLUCOSE 144* 196* 174*  BUN 18 14 7   CREATININE 0.58 0.54 0.47*  CALCIUM 8.1* 7.7* 8.0*   LFT  Recent Labs  08/20/13 0435  PROT 4.9*  ALBUMIN 2.6*  AST 20  ALT 10  ALKPHOS 35*  BILITOT 0.9   PT/INR No results found for this basename: LABPROT, INR,  in the last 72 hours Hepatitis Panel No results found for this basename: HEPBSAG, HCVAB, HEPAIGM, HEPBIGM,  in the last 72 hours C-Diff No results found for this basename: CDIFFTOX,  in the last 72 hours Fecal Lactopherrin No results found for this basename: FECLLACTOFRN,  in the last 72 hours  Studies/Results: Dg Chest 2 View  08/21/2013   CLINICAL DATA:  Resolution of pneumonitis  EXAM: CHEST  2 VIEW  COMPARISON:  08/19/2013  FINDINGS: Right middle lobe infiltrate shows partial clearing.  Interval development of small bilateral pleural effusions. Question vascular congestion and fluid overload.  IMPRESSION: Partial clearing of  right middle lobe infiltrate.  Interval development of small effusions and vascular congestion, question mild fluid overload.   Electronically Signed   By: Marlan Palau M.D.   On: 08/21/2013 10:35    Medications:  Scheduled: . brimonidine  1 drop Both Eyes BID  . clindamycin (CLEOCIN) IV  600 mg Intravenous Q8H  . dorzolamide-timolol  1 drop Both Eyes BID  . insulin aspart  0-9 Units Subcutaneous TID WC  . pantoprazole (PROTONIX) IV  40 mg Intravenous Q12H  . sodium chloride  3 mL Intravenous Q12H  . sucralfate  1 g Oral QID   Continuous:   Assessment/Plan: 1) Caustic ingestion with resultant severe esophagitis and gastritis. 2) Chemical pneumonitis. 3) Psychiatric disorder.   The patient is clinically stable, however, her WBC has increased.  No worsening of any pain at this time.  She is very concerned about receiving antibiotics and pain medications.  In fact, she continued to perseverate on these issues after multiple reassurances.  Plan: 1)  Continue with antibiotics and pain medications. 2) ? Timing of EGD.  She may not require one as the days progress, although she is at risk of developing esophageal stricture in the future.   LOS: 2 days   Karo Rog D 08/21/2013, 4:29 PM

## 2013-08-21 NOTE — Progress Notes (Signed)
Clinical Social Work  CSW met with patient and son at bedside. Patient laying in bed and reports she wants to go home today. Per MD, patient is not medically stable. Patient reports that she feels better and is happy today. CSW explained that MD will not allow her DC today and that at DC she will have to go to a psychiatric hospital. Patient is upset and reports she does not feel she needs psychiatric treatment. CSW explained psych MD recommendations and that she would need to go at DC. Patient agreeable but not happy with the DC plan.  CSW will continue to follow.  [place], [place] [phone]

## 2013-08-21 NOTE — Progress Notes (Signed)
[facility] PROGRESS NOTE  Robin Perry ZOX:[id number] DOB: 1946/05/25 DOA: 08/19/2013 PCP: No primary provider on file. bRIEF HPI: Robin Perry is a 67 y.o. female history of diabetes mellitus type 2, hypertension was brought to the ER after patient admitted to her husband that she had taken Clorox. Patient was feeling depressed yesterday morning when she woke up and then she took 2 cups of Clorox(do not know the exact quantity) with a handful of her home medications which includes metformin/glipizide/lisinopril/Tylenol. Patient otherwise is hemodynamically stable. Salicylates and Tylenol levels are negative. Urine drug screen is negative. Poison control was consulted. At this time they have recommended observation and if patient continues to have bleeding and needs GI evaluation for any persistent bleed from the Clorox. CT CHEST shows Thickened, indistinct esophageal and gastric walls concerning for esophagitis and gastritis a. Adjacent scratch head free fluid adjacent to the stomach and spleen in the left upper abdomen. Airspace disease in the right middle lobe concerning for pneumonia  or pneumonitis. Dependent atelectasis in the lungs. GI consulted, and she was started on IV protonix and IV clindamycin.     Assessment/Plan:  Suicidal attempt with multiple drugs - suicide precautions with sitter.  Patient will be hydrated and closely follow CBC and metabolic panel. Psychiatry consulted.    #2. GI bleed - probably secondary to Clorox ingestion.  Protonix IV. Closely follow CBC. Type and screen. Consulted GI. Stable H&H. On clears.   #3 Possible pneumonitis/pneumonia - CT chest is done and showing pneumonitis.  Patient has been placed on clindamycin for possible aspiration pneumonitis. When necessary albuterol.   #4 Severe hyponatremia - at this time and gently hydrating. Closely follow metabolic panel. Check urine sodium and osmolality and check TSH.   #5 Chest pain - probably secondary to  Clorox ingestion.  Troponin negative.  #6 Diabetes mellitus type 2 - presently patient is being placed on sliding-scale coverage as patient is n.p.o. Patient has taken glipizide and may have late effect hyperglycemia. Closely follow CBGs.  #7History of hypertension - patient did take lisinopril also yesterday. Closely observe vital signs.   Code Status: full code Family Communication: family at bedside Disposition Plan: pending.    Consultants:  GI CONSULT from Dr Loreta Ave  Psychiatry consult  Procedures:  None so far  Antibiotics:  IV clindamycin.   HPI/Subjective:  sub sternal burning sensation improving.  coughing and pain on taking food Objective: Filed Vitals:   08/21/13 1343  BP: 128/68  Pulse: 80  Temp: 99.1 F (37.3 C)  Resp: 20    Intake/Output Summary (Last 24 hours) at 08/21/13 1714 Last data filed at 08/21/13 1706  Gross per 24 hour  Intake 2631.25 ml  Output      0 ml  Net 2631.25 ml   Filed Weights   08/19/13 0607 08/20/13 0124 08/21/13 0514  Weight: 51.937 kg (114 lb 8 oz) 50.8 kg (111 lb 15.9 oz) 48.3 kg (106 lb 7.7 oz)    Exam:   General:  Alert afebrile comfortable  Cardiovascular: s1s2  Respiratory: scattered rhonchi  Abdomen: soft MILDLY tender in the epigastric area  Musculoskeletal: no pedal edema.   Data Reviewed: Basic Metabolic Panel:  Recent Labs Lab 08/19/13 0145 08/19/13 1045 08/19/13 1346 08/20/13 0435 08/21/13 1105 08/21/13 1437  NA 122* 126* 127* 125* 130*  --   K 4.0 3.5 3.6 3.2* 3.3*  --   CL 90* 94* 95* 93* 95*  --   CO2 16* 22 21 22 22   --  GLUCOSE 276* 209* 144* 196* 174*  --   BUN 31* 20 18 14 7   --   CREATININE 0.92 0.72 0.58 0.54 0.47*  --   CALCIUM 8.9 8.0* 8.1* 7.7* 8.0*  --   MG  --   --   --   --   --  1.7   Liver Function Tests:  Recent Labs Lab 08/19/13 0145 08/20/13 0435  AST 36 20  ALT 16 10  ALKPHOS 53 35*  BILITOT 0.9 0.9  PROT 6.1 4.9*  ALBUMIN 3.5 2.6*   No results found for  this basename: LIPASE, AMYLASE,  in the last 168 hours No results found for this basename: AMMONIA,  in the last 168 hours CBC:  Recent Labs Lab 08/19/13 0145 08/19/13 0645 08/20/13 0435 08/20/13 0940 08/21/13 1105  WBC 17.5* 10.5 13.3* 14.5* 17.9*  NEUTROABS 14.4* 8.0*  --   --   --   HGB 14.4 12.9 10.3* 10.6* 10.2*  HCT 39.8 36.1 29.1* 29.1* 28.2*  MCV 87.5 87.6 87.4 87.1 87.6  PLT 187 150 120* 135* 140*   Cardiac Enzymes:  Recent Labs Lab 08/19/13 0450  TROPONINI <0.30   BNP (last 3 results) No results found for this basename: PROBNP,  in the last 8760 hours CBG:  Recent Labs Lab 08/20/13 2352 08/21/13 0406 08/21/13 0739 08/21/13 1129 08/21/13 1700  GLUCAP 190* 175* 160* 164* 197*    No results found for this or any previous visit (from the past 240 hour(s)).   Studies: Dg Chest 2 View  08/21/2013   CLINICAL DATA:  Resolution of pneumonitis  EXAM: CHEST  2 VIEW  COMPARISON:  08/19/2013  FINDINGS: Right middle lobe infiltrate shows partial clearing.  Interval development of small bilateral pleural effusions. Question vascular congestion and fluid overload.  IMPRESSION: Partial clearing of right middle lobe infiltrate.  Interval development of small effusions and vascular congestion, question mild fluid overload.   Electronically Signed   By: Marlan Palau M.D.   On: 08/21/2013 10:35    Scheduled Meds: . brimonidine  1 drop Both Eyes BID  . clindamycin (CLEOCIN) IV  600 mg Intravenous Q8H  . dorzolamide-timolol  1 drop Both Eyes BID  . insulin aspart  0-9 Units Subcutaneous TID WC  . pantoprazole (PROTONIX) IV  40 mg Intravenous Q12H  . sodium chloride  3 mL Intravenous Q12H  . sucralfate  1 g Oral QID   Continuous Infusions:    Principal Problem:   Suicide attempt Active Problems:   Diabetes mellitus   HTN (hypertension)   Pneumonitis    Time spent: >30 min    [name]  [facility] Pager [phone] If 7PM-7AM, please contact  night-coverage at [email or web address], password [facility] 08/21/2013, 5:14 PM  LOS: 2 days

## 2013-08-22 LAB — GLUCOSE, CAPILLARY
Glucose-Capillary: 174 mg/dL — ABNORMAL HIGH (ref 70–99)
Glucose-Capillary: 185 mg/dL — ABNORMAL HIGH (ref 70–99)
Glucose-Capillary: 161 mg/dL — ABNORMAL HIGH (ref 70–99)
Glucose-Capillary: 177 mg/dL — ABNORMAL HIGH (ref 70–99)

## 2013-08-22 LAB — CBC
MCH: 31.4 pg (ref 26.0–34.0)
Platelets: 186 10*3/uL (ref 150–400)
RBC: 3.34 MIL/uL — ABNORMAL LOW (ref 3.87–5.11)
WBC: 16.3 10*3/uL — ABNORMAL HIGH (ref 4.0–10.5)

## 2013-08-22 NOTE — Progress Notes (Signed)
Per MD, Pt not ready for d/c today; may be ready tomorrow.  CSW to continue to follow.  Providence Crosby, LCSWA Clinical Social Work [phone]

## 2013-08-22 NOTE — Progress Notes (Addendum)
[facility] PROGRESS NOTE  Robin Perry RUE:[id number] DOB: 1946/07/18 DOA: 08/19/2013 PCP: No primary provider on file. bRIEF HPI: Robin Perry is a 67 y.o. female history of diabetes mellitus type 2, hypertension was brought to the ER after patient admitted to her husband that she had taken Clorox. Patient was feeling depressed yesterday morning when she woke up and then she took 2 cups of Clorox(do not know the exact quantity) with a handful of her home medications which includes metformin/glipizide/lisinopril/Tylenol. Patient otherwise is hemodynamically stable. Salicylates and Tylenol levels are negative. Urine drug screen is negative. Poison control was consulted. At this time they have recommended observation and if patient continues to have bleeding and needs GI evaluation for any persistent bleed from the Clorox. CT CHEST shows Thickened, indistinct esophageal and gastric walls concerning for esophagitis and gastritis a. Adjacent scratch head free fluid adjacent to the stomach and spleen in the left upper abdomen. Airspace disease in the right middle lobe concerning for pneumonia  or pneumonitis. Dependent atelectasis in the lungs. GI consulted, and she was started on IV protonix and IV clindamycin.     Assessment/Plan:  Suicidal attempt with multiple drugs - suicide precautions with sitter.  Patient will be hydrated and closely follow CBC and metabolic panel. Psychiatry consulted.    #2. GI bleed - probably secondary to Clorox ingestion.  Protonix IV. Closely follow CBC. Type and screen. Consulted GI. Stable H&H. On clears.   #3 Possible pneumonitis/pneumonia - CT chest is done and showing pneumonitis.  Patient has been placed on clindamycin for possible aspiration pneumonitis. When necessary albuterol.   #4 Severe hyponatremia - at this time and gently hydrating. Closely follow metabolic panel. Check urine sodium and osmolality and check TSH.   #5 Chest pain - probably secondary to  Clorox ingestion.  Troponin negative.  #6 Diabetes mellitus type 2 - presently patient is being placed on sliding-scale coverage as patient is n.p.o. Patient has taken glipizide and may have late effect hyperglycemia. Closely follow CBGs.  #7History of hypertension - patient did take lisinopril also yesterday. Closely observe vital signs.   Code Status: full code Family Communication: family at bedside Disposition Plan: pending.    Consultants:  GI CONSULT from Dr Loreta Ave  Psychiatry consult  Procedures:  None so far  Antibiotics:  IV clindamycin.   HPI/Subjective:  sub sternal burning sensation improving.  coughing and pain on taking food Objective: Filed Vitals:   08/22/13 1513  BP: 170/65  Pulse: 62  Temp: 97.7 F (36.5 C)  Resp: 18    Intake/Output Summary (Last 24 hours) at 08/22/13 1530 Last data filed at 08/22/13 1200  Gross per 24 hour  Intake    870 ml  Output      0 ml  Net    870 ml   Filed Weights   08/20/13 0124 08/21/13 0514 08/22/13 0623  Weight: 50.8 kg (111 lb 15.9 oz) 48.3 kg (106 lb 7.7 oz) 49.079 kg (108 lb 3.2 oz)    Exam:   General:  Alert afebrile comfortable  Cardiovascular: s1s2  Respiratory: scattered rhonchi  Abdomen: soft MILDLY tender in the epigastric area  Musculoskeletal: no pedal edema.   Data Reviewed: Basic Metabolic Panel:  Recent Labs Lab 08/19/13 0145 08/19/13 1045 08/19/13 1346 08/20/13 0435 08/21/13 1105 08/21/13 1437  NA 122* 126* 127* 125* 130*  --   K 4.0 3.5 3.6 3.2* 3.3*  --   CL 90* 94* 95* 93* 95*  --  CO2 16* 22 21 22 22   --   GLUCOSE 276* 209* 144* 196* 174*  --   BUN 31* 20 18 14 7   --   CREATININE 0.92 0.72 0.58 0.54 0.47*  --   CALCIUM 8.9 8.0* 8.1* 7.7* 8.0*  --   MG  --   --   --   --   --  1.7   Liver Function Tests:  Recent Labs Lab 08/19/13 0145 08/20/13 0435  AST 36 20  ALT 16 10  ALKPHOS 53 35*  BILITOT 0.9 0.9  PROT 6.1 4.9*  ALBUMIN 3.5 2.6*   No results found for  this basename: LIPASE, AMYLASE,  in the last 168 hours No results found for this basename: AMMONIA,  in the last 168 hours CBC:  Recent Labs Lab 08/19/13 0145 08/19/13 0645 08/20/13 0435 08/20/13 0940 08/21/13 1105 08/22/13 1117  WBC 17.5* 10.5 13.3* 14.5* 17.9* 16.3*  NEUTROABS 14.4* 8.0*  --   --   --   --   HGB 14.4 12.9 10.3* 10.6* 10.2* 10.5*  HCT 39.8 36.1 29.1* 29.1* 28.2* 29.0*  MCV 87.5 87.6 87.4 87.1 87.6 86.8  PLT 187 150 120* 135* 140* 186   Cardiac Enzymes:  Recent Labs Lab 08/19/13 0450  TROPONINI <0.30   BNP (last 3 results) No results found for this basename: PROBNP,  in the last 8760 hours CBG:  Recent Labs Lab 08/21/13 2012 08/21/13 2358 08/22/13 0418 08/22/13 0707 08/22/13 1127  GLUCAP 171* 200* 174* 185* 161*    No results found for this or any previous visit (from the past 240 hour(s)).   Studies: Dg Chest 2 View  08/21/2013   CLINICAL DATA:  Resolution of pneumonitis  EXAM: CHEST  2 VIEW  COMPARISON:  08/19/2013  FINDINGS: Right middle lobe infiltrate shows partial clearing.  Interval development of small bilateral pleural effusions. Question vascular congestion and fluid overload.  IMPRESSION: Partial clearing of right middle lobe infiltrate.  Interval development of small effusions and vascular congestion, question mild fluid overload.   Electronically Signed   By: Marlan Palau M.D.   On: 08/21/2013 10:35    Scheduled Meds: . albuterol  2.5 mg Nebulization Q6H WA  . brimonidine  1 drop Both Eyes BID  . clindamycin (CLEOCIN) IV  600 mg Intravenous Q8H  . dorzolamide-timolol  1 drop Both Eyes BID  . insulin aspart  0-9 Units Subcutaneous TID WC  . pantoprazole (PROTONIX) IV  40 mg Intravenous Q12H  . sodium chloride  3 mL Intravenous Q12H  . sucralfate  1 g Oral QID   Continuous Infusions:    Principal Problem:   Suicide attempt Active Problems:   Diabetes mellitus   HTN (hypertension)   Pneumonitis    Time spent: 20  min    Rashidah Belleville  [facility] Pager [phone] If 7PM-7AM, please contact night-coverage at [email or web address], password [facility] 08/22/2013, 3:30 PM  LOS: 3 days

## 2013-08-22 NOTE — Progress Notes (Signed)
Subjective: No acute events. Complains of some chest pain.  Objective: Vital signs in last 24 hours: Temp:  [98.3 F (36.8 C)-99.2 F (37.3 C)] 99.2 F (37.3 C) (10/04 0623) Pulse Rate:  [72-86] 86 (10/04 0623) Resp:  [18-20] 18 (10/04 0623) BP: (128-139)/(68-80) 139/80 mmHg (10/04 0623) SpO2:  [95 %-98 %] 96 % (10/04 0623) Weight:  [108 lb 3.2 oz (49.079 kg)] 108 lb 3.2 oz (49.079 kg) (10/04 0981) Last BM Date: 08/20/13  Intake/Output from previous day: 10/03 0701 - 10/04 0700 In: 917.5 [P.O.:480; I.V.:287.5; IV Piggyback:150] Out: -  Intake/Output this shift: Total I/O In: 240 [P.O.:240] Out: -   General appearance: alert and no distress GI: soft, non-tender; bowel sounds normal; no masses,  no organomegaly  Lab Results:  Recent Labs  08/20/13 0435 08/20/13 0940 08/21/13 1105  WBC 13.3* 14.5* 17.9*  HGB 10.3* 10.6* 10.2*  HCT 29.1* 29.1* 28.2*  PLT 120* 135* 140*   BMET  Recent Labs  08/19/13 1346 08/20/13 0435 08/21/13 1105  NA 127* 125* 130*  K 3.6 3.2* 3.3*  CL 95* 93* 95*  CO2 21 22 22   GLUCOSE 144* 196* 174*  BUN 18 14 7   CREATININE 0.58 0.54 0.47*  CALCIUM 8.1* 7.7* 8.0*   LFT  Recent Labs  08/20/13 0435  PROT 4.9*  ALBUMIN 2.6*  AST 20  ALT 10  ALKPHOS 35*  BILITOT 0.9   PT/INR No results found for this basename: LABPROT, INR,  in the last 72 hours Hepatitis Panel No results found for this basename: HEPBSAG, HCVAB, HEPAIGM, HEPBIGM,  in the last 72 hours C-Diff No results found for this basename: CDIFFTOX,  in the last 72 hours Fecal Lactopherrin No results found for this basename: FECLLACTOFRN,  in the last 72 hours  Studies/Results: Dg Chest 2 View  08/21/2013   CLINICAL DATA:  Resolution of pneumonitis  EXAM: CHEST  2 VIEW  COMPARISON:  08/19/2013  FINDINGS: Right middle lobe infiltrate shows partial clearing.  Interval development of small bilateral pleural effusions. Question vascular congestion and fluid overload.   IMPRESSION: Partial clearing of right middle lobe infiltrate.  Interval development of small effusions and vascular congestion, question mild fluid overload.   Electronically Signed   By: Marlan Palau M.D.   On: 08/21/2013 10:35    Medications:  Scheduled: . albuterol  2.5 mg Nebulization Q6H WA  . brimonidine  1 drop Both Eyes BID  . clindamycin (CLEOCIN) IV  600 mg Intravenous Q8H  . dorzolamide-timolol  1 drop Both Eyes BID  . insulin aspart  0-9 Units Subcutaneous TID WC  . pantoprazole (PROTONIX) IV  40 mg Intravenous Q12H  . sodium chloride  3 mL Intravenous Q12H  . sucralfate  1 g Oral QID   Continuous:   Assessment/Plan: 1) Caustic ingestion. 2) Chemical pneumonitis. 3) Suicide attempt.   Patient remains stable.  AM labs pending.  No new recommendations.  Plan: 1) Continue with antibiotics. 2) I anticipate a resolution of her chest pain in the near future. 3) Continue with sitter.   LOS: 3 days   Robin Perry D 08/22/2013, 7:53 AM

## 2013-08-23 LAB — GLUCOSE, CAPILLARY
Glucose-Capillary: 212 mg/dL — ABNORMAL HIGH (ref 70–99)
Glucose-Capillary: 279 mg/dL — ABNORMAL HIGH (ref 70–99)
Glucose-Capillary: 164 mg/dL — ABNORMAL HIGH (ref 70–99)

## 2013-08-23 LAB — PROCALCITONIN: Procalcitonin: 0.12 ng/mL

## 2013-08-23 MED ORDER — PREDNISONE 20 MG PO TABS
40.0000 mg | ORAL_TABLET | Freq: Once | ORAL | Status: AC
  Administered 2013-08-23: 40 mg via ORAL
  Filled 2013-08-23: qty 2

## 2013-08-23 MED ORDER — PREDNISONE 20 MG PO TABS
40.0000 mg | ORAL_TABLET | Freq: Every day | ORAL | Status: DC
  Administered 2013-08-24 – 2013-08-25 (×2): 40 mg via ORAL
  Filled 2013-08-23 (×3): qty 2

## 2013-08-23 MED ORDER — PHENOL 1.4 % MT LIQD
1.0000 | OROMUCOSAL | Status: DC | PRN
  Administered 2013-08-23 (×2): 1 via OROMUCOSAL
  Filled 2013-08-23: qty 177

## 2013-08-23 NOTE — Progress Notes (Signed)
Inetta Fermo stating that PA feels that Pt would be better suited for [facility].  Notified MD.  Providence Crosby, LCSWA Clinical Social Work [phone]

## 2013-08-23 NOTE — Progress Notes (Signed)
Per MD, Pt ready for d/c.  Referral made to [facility].  Per Baxter Hire, likely that there will be some d/cs today.    Kristen to review Pt and notify CSW of disposition.  Providence Crosby, LCSWA Clinical Social Work [phone]

## 2013-08-23 NOTE — Progress Notes (Signed)
Subjective: Complains of a dry throat and sore throat.  Improvement in her chest pain.  Objective: Vital signs in last 24 hours: Temp:  [97.4 F (36.3 C)-97.7 F (36.5 C)] 97.4 F (36.3 C) (10/05 0616) Pulse Rate:  [61-66] 66 (10/05 0616) Resp:  [16-18] 18 (10/05 0616) BP: (124-170)/(65-82) 168/82 mmHg (10/05 0616) SpO2:  [94 %-100 %] 96 % (10/05 0821) Weight:  [104 lb 6.4 oz (47.356 kg)] 104 lb 6.4 oz (47.356 kg) (10/05 0616) Last BM Date: 08/20/13  Intake/Output from previous day: 10/04 0701 - 10/05 0700 In: 960 [P.O.:960] Out: -  Intake/Output this shift:    General appearance: alert and no distress GI: soft, non-tender; bowel sounds normal; no masses,  no organomegaly  Lab Results:  Recent Labs  08/20/13 0940 08/21/13 1105 08/22/13 1117  WBC 14.5* 17.9* 16.3*  HGB 10.6* 10.2* 10.5*  HCT 29.1* 28.2* 29.0*  PLT 135* 140* 186   BMET  Recent Labs  08/21/13 1105  NA 130*  K 3.3*  CL 95*  CO2 22  GLUCOSE 174*  BUN 7  CREATININE 0.47*  CALCIUM 8.0*   LFT No results found for this basename: PROT, ALBUMIN, AST, ALT, ALKPHOS, BILITOT, BILIDIR, IBILI,  in the last 72 hours PT/INR No results found for this basename: LABPROT, INR,  in the last 72 hours Hepatitis Panel No results found for this basename: HEPBSAG, HCVAB, HEPAIGM, HEPBIGM,  in the last 72 hours C-Diff No results found for this basename: CDIFFTOX,  in the last 72 hours Fecal Lactopherrin No results found for this basename: FECLLACTOFRN,  in the last 72 hours  Studies/Results: Dg Chest 2 View  08/21/2013   CLINICAL DATA:  Resolution of pneumonitis  EXAM: CHEST  2 VIEW  COMPARISON:  08/19/2013  FINDINGS: Right middle lobe infiltrate shows partial clearing.  Interval development of small bilateral pleural effusions. Question vascular congestion and fluid overload.  IMPRESSION: Partial clearing of right middle lobe infiltrate.  Interval development of small effusions and vascular congestion, question  mild fluid overload.   Electronically Signed   By: Marlan Palau M.D.   On: 08/21/2013 10:35    Medications:  Scheduled: . albuterol  2.5 mg Nebulization Q6H WA  . brimonidine  1 drop Both Eyes BID  . clindamycin (CLEOCIN) IV  600 mg Intravenous Q8H  . dorzolamide-timolol  1 drop Both Eyes BID  . insulin aspart  0-9 Units Subcutaneous TID WC  . pantoprazole (PROTONIX) IV  40 mg Intravenous Q12H  . sodium chloride  3 mL Intravenous Q12H  . sucralfate  1 g Oral QID   Continuous:   Assessment/Plan: 1) Caustic ingestion. 2) Sore throat. 3) Suicide attempt.   The patient remains clinically stable.  No abnormalities palpated in her neck.  She is tolerating PO.  Plan: 1) Chloraseptic spray. 2) Transfer to inpatient psych per Hospitalist.   LOS: 4 days   Robin Perry D 08/23/2013, 8:47 AM

## 2013-08-23 NOTE — Progress Notes (Signed)
[facility] PROGRESS NOTE  SHERREE SHANKMAN ZOX:[id number] DOB: 01/21/1946 DOA: 08/19/2013 PCP: No primary provider on file. bRIEF HPI: Robin Perry is a 67 y.o. female history of diabetes mellitus type 2, hypertension was brought to the ER after patient admitted to her husband that she had taken Clorox. Patient was feeling depressed yesterday morning when she woke up and then she took 2 cups of Clorox(do not know the exact quantity) with a handful of her home medications which includes metformin/glipizide/lisinopril/Tylenol. Patient otherwise is hemodynamically stable. Salicylates and Tylenol levels are negative. Urine drug screen is negative. Poison control was consulted. At this time they have recommended observation and if patient continues to have bleeding and needs GI evaluation for any persistent bleed from the Clorox. CT CHEST shows Thickened, indistinct esophageal and gastric walls concerning for esophagitis and gastritis a. Adjacent scratch head free fluid adjacent to the stomach and spleen in the left upper abdomen. Airspace disease in the right middle lobe concerning for pneumonia  or pneumonitis. Dependent atelectasis in the lungs. GI consulted, and she was started on IV protonix and IV clindamycin.     Assessment/Plan:  Suicidal attempt with multiple drugs - suicide precautions with sitter.  Patient will be hydrated and closely follow CBC and metabolic panel. Psychiatry consulted recommended inpatient psych admission when medically ready. Asked the pt if she is still having suicidal thoughts, she thought for a few seconds and answered , i feel she is not telling the truth. Will request psychiatry to reevaluate in am.    #2. GI bleed - probably secondary to Clorox ingestion.  Protonix IV. Closely follow CBC. Type and screen. Consulted GI. Stable H&H. She was started on clears and advanced to soft today.   #3 Possible pneumonitis/pneumonia - CT chest is done and showing pneumonitis.  Patient  has been placed on clindamycin for possible aspiration pneumonitis. When necessary albuterol. She completed 5 days of antibiotics. Will d/c clindamycin and add augmentin. Will also do trial of prednisone.    #4 Severe hyponatremia - at this time and gently hydrating. Closely follow metabolic panel.   #5 Chest pain - probably secondary to Clorox ingestion.  Troponin negative.  #6 Diabetes mellitus type 2 - presently patient is being placed on sliding-scale coverage as patient is n.p.o. Patient has taken glipizide and may have late effect hyperglycemia. Closely follow CBGs.  #7History of hypertension - patient did take lisinopril also yesterday. Closely observe vital signs.   Code Status: full code Family Communication: none at bedside Disposition Plan: pending.    Consultants:  GI CONSULT from Dr Loreta Ave  Psychiatry consult  Procedures:  None so far  Antibiotics:  IV clindamycin. 10/1 to 10/5  Augmentin.   HPI/Subjective:  sub sternal burning sensation improving.  coughing and pain on taking food continues.  Objective: Filed Vitals:   08/23/13 1446  BP: 153/81  Pulse: 85  Temp: 97.7 F (36.5 C)  Resp: 20    Intake/Output Summary (Last 24 hours) at 08/23/13 1808 Last data filed at 08/23/13 1728  Gross per 24 hour  Intake    840 ml  Output      0 ml  Net    840 ml   Filed Weights   08/21/13 0514 08/22/13 0623 08/23/13 0616  Weight: 48.3 kg (106 lb 7.7 oz) 49.079 kg (108 lb 3.2 oz) 47.356 kg (104 lb 6.4 oz)    Exam:   General:  Alert afebrile comfortable  Cardiovascular: s1s2  Respiratory: scattered rhonchi  Abdomen: soft MILDLY tender in the epigastric area  Musculoskeletal: no pedal edema.   Data Reviewed: Basic Metabolic Panel:  Recent Labs Lab 08/19/13 0145 08/19/13 1045 08/19/13 1346 08/20/13 0435 08/21/13 1105 08/21/13 1437  NA 122* 126* 127* 125* 130*  --   K 4.0 3.5 3.6 3.2* 3.3*  --   CL 90* 94* 95* 93* 95*  --   CO2 16* 22 21 22 22    --   GLUCOSE 276* 209* 144* 196* 174*  --   BUN 31* 20 18 14 7   --   CREATININE 0.92 0.72 0.58 0.54 0.47*  --   CALCIUM 8.9 8.0* 8.1* 7.7* 8.0*  --   MG  --   --   --   --   --  1.7   Liver Function Tests:  Recent Labs Lab 08/19/13 0145 08/20/13 0435  AST 36 20  ALT 16 10  ALKPHOS 53 35*  BILITOT 0.9 0.9  PROT 6.1 4.9*  ALBUMIN 3.5 2.6*   No results found for this basename: LIPASE, AMYLASE,  in the last 168 hours No results found for this basename: AMMONIA,  in the last 168 hours CBC:  Recent Labs Lab 08/19/13 0145 08/19/13 0645 08/20/13 0435 08/20/13 0940 08/21/13 1105 08/22/13 1117  WBC 17.5* 10.5 13.3* 14.5* 17.9* 16.3*  NEUTROABS 14.4* 8.0*  --   --   --   --   HGB 14.4 12.9 10.3* 10.6* 10.2* 10.5*  HCT 39.8 36.1 29.1* 29.1* 28.2* 29.0*  MCV 87.5 87.6 87.4 87.1 87.6 86.8  PLT 187 150 120* 135* 140* 186   Cardiac Enzymes:  Recent Labs Lab 08/19/13 0450  TROPONINI <0.30   BNP (last 3 results) No results found for this basename: PROBNP,  in the last 8760 hours CBG:  Recent Labs Lab 08/22/13 2350 08/23/13 0356 08/23/13 0703 08/23/13 1121 08/23/13 1606  GLUCAP 193* 172* 223* 212* 279*    No results found for this or any previous visit (from the past 240 hour(s)).   Studies: No results found.  Scheduled Meds: . albuterol  2.5 mg Nebulization Q6H WA  . brimonidine  1 drop Both Eyes BID  . dorzolamide-timolol  1 drop Both Eyes BID  . insulin aspart  0-9 Units Subcutaneous TID WC  . pantoprazole (PROTONIX) IV  40 mg Intravenous Q12H  . [START ON 08/24/2013] predniSONE  40 mg Oral QAC breakfast  . sodium chloride  3 mL Intravenous Q12H  . sucralfate  1 g Oral QID   Continuous Infusions:    Principal Problem:   Suicide attempt Active Problems:   Diabetes mellitus   HTN (hypertension)   Pneumonitis    Time spent: 20 min    Arrington Yohe  [facility] Pager [phone] If 7PM-7AM, please contact night-coverage at [email or web address],  password [facility] 08/23/2013, 6:08 PM  LOS: 4 days

## 2013-08-23 NOTE — Progress Notes (Signed)
Robin Perry, [facility], stating that Pt is currently being reviewed by the PA.  Providence Crosby, LCSWA Clinical Social Work [phone]

## 2013-08-24 LAB — GLUCOSE, CAPILLARY
Glucose-Capillary: 202 mg/dL — ABNORMAL HIGH (ref 70–99)
Glucose-Capillary: 193 mg/dL — ABNORMAL HIGH (ref 70–99)
Glucose-Capillary: 273 mg/dL — ABNORMAL HIGH (ref 70–99)
Glucose-Capillary: 259 mg/dL — ABNORMAL HIGH (ref 70–99)

## 2013-08-24 LAB — CBC
HCT: 31.3 % — ABNORMAL LOW (ref 36.0–46.0)
Hemoglobin: 11.3 g/dL — ABNORMAL LOW (ref 12.0–15.0)
MCH: 31.2 pg (ref 26.0–34.0)
MCHC: 36.1 g/dL — ABNORMAL HIGH (ref 30.0–36.0)
WBC: 9.6 10*3/uL (ref 4.0–10.5)

## 2013-08-24 LAB — BASIC METABOLIC PANEL
BUN: 8 mg/dL (ref 6–23)
Chloride: 93 mEq/L — ABNORMAL LOW (ref 96–112)
GFR calc Af Amer: >90 mL/min (ref >90)
Glucose, Bld: 219 mg/dL — ABNORMAL HIGH (ref 70–99)
Potassium: 3 mEq/L — ABNORMAL LOW (ref 3.5–5.1)

## 2013-08-24 LAB — OSMOLALITY: Osmolality: 278 mOsm/kg (ref 275–300)

## 2013-08-24 LAB — CREATININE, URINE, RANDOM: Creatinine, Urine: 35.5 mg/dL

## 2013-08-24 MED ORDER — INSULIN ASPART 100 UNIT/ML ~~LOC~~ SOLN
0.0000 [IU] | Freq: Every day | SUBCUTANEOUS | Status: DC
  Administered 2013-08-24: 3 [IU] via SUBCUTANEOUS

## 2013-08-24 MED ORDER — POTASSIUM CHLORIDE 10 MEQ/100ML IV SOLN
10.0000 meq | INTRAVENOUS | Status: AC
  Administered 2013-08-24 (×2): 10 meq via INTRAVENOUS
  Filled 2013-08-24 (×2): qty 100

## 2013-08-24 MED ORDER — POTASSIUM CHLORIDE 20 MEQ/15ML (10%) PO LIQD
40.0000 meq | Freq: Every day | ORAL | Status: DC
  Administered 2013-08-24: 40 meq via ORAL
  Filled 2013-08-24 (×2): qty 30

## 2013-08-24 MED ORDER — INSULIN ASPART 100 UNIT/ML ~~LOC~~ SOLN
0.0000 [IU] | Freq: Three times a day (TID) | SUBCUTANEOUS | Status: DC
  Administered 2013-08-24: 5 [IU] via SUBCUTANEOUS
  Administered 2013-08-25: 3 [IU] via SUBCUTANEOUS
  Administered 2013-08-25: 5 [IU] via SUBCUTANEOUS

## 2013-08-24 MED ORDER — SODIUM CHLORIDE 0.9 % IV SOLN
INTRAVENOUS | Status: AC
  Administered 2013-08-24: 19:00:00 via INTRAVENOUS

## 2013-08-24 MED ORDER — AMOXICILLIN-POT CLAVULANATE 875-125 MG PO TABS
1.0000 | ORAL_TABLET | Freq: Two times a day (BID) | ORAL | Status: DC
  Administered 2013-08-24 – 2013-08-25 (×3): 1 via ORAL
  Filled 2013-08-24 (×4): qty 1

## 2013-08-24 NOTE — Progress Notes (Signed)
Clinical Social Work  CSW continues to search for bed placement:  [facility] reports they feel patient is more appropriate for [facility] reports no available beds today  [facility] reports that patient has not been reviewed by MD yet but RN feels that sodium levels need to be addressed prior to admission.  CSW text paged MD information and will continue to follow.  [place], [place] [phone]

## 2013-08-24 NOTE — Progress Notes (Signed)
[facility] PROGRESS NOTE  Robin Perry YQM:[id number] DOB: 24-Dec-1945 DOA: 08/19/2013 PCP: No primary provider on file. bRIEF HPI: Robin Perry is a 67 y.o. female history of diabetes mellitus type 2, hypertension was brought to the ER after patient admitted to her husband that she had taken Clorox. Patient was feeling depressed yesterday morning when she woke up and then she took 2 cups of Clorox(do not know the exact quantity) with a handful of her home medications which includes metformin/glipizide/lisinopril/Tylenol. Patient otherwise is hemodynamically stable. Salicylates and Tylenol levels are negative. Urine drug screen is negative. Poison control was consulted. At this time they have recommended observation and if patient continues to have bleeding and needs GI evaluation for any persistent bleed from the Clorox. CT CHEST shows Thickened, indistinct esophageal and gastric walls concerning for esophagitis and gastritis a. Adjacent scratch head free fluid adjacent to the stomach and spleen in the left upper abdomen. Airspace disease in the right middle lobe concerning for pneumonia  or pneumonitis. Dependent atelectasis in the lungs. GI consulted, and she was started on IV protonix and IV clindamycin.     Assessment/Plan:  Suicidal attempt with multiple drugs - suicide precautions with sitter.  Patient will be hydrated and closely follow CBC and metabolic panel. Psychiatry consulted recommended inpatient psych admission when medically ready. Asked the pt if she is still having suicidal thoughts, she thought for a few seconds and answered , i feel she is not telling the truth. Psychiatry social worker is working to get a bed at [facility].    #2. GI bleed - probably secondary to Clorox ingestion.  She was started on IV protonix, will change to po protonix as she is on soft diet... Consulted GI. Stable H&H. She was started on clears and advanced to soft. She continues to have coughing on eating, but  improved when compared to last 2 days.   #3 Possible pneumonitis/pneumonia - CT chest is done and showing pneumonitis.  Patient has been placed on clindamycin for possible aspiration pneumonitis. When necessary albuterol. She completed 5 days of antibiotics. Will d/c clindamycin and add augmentin. Will also do trial of prednisone.    #4 Severe hyponatremia - possibly from the electrolyte imbalance from the intoxication vs mild dehydration. She was given IV fluids and her sodium improved to 130 on 10/5 . We will further evaluate for SIADH.   #5 Chest pain - probably secondary to Clorox ingestion.  Troponin negative.  Improved.  #6 Diabetes mellitus type 2 - presently patient is being placed on sliding-scale coverage as patient is n.p.o. Patient has taken glipizide and may have late effect hyperglycemia. Closely follow CBGs.  CBG (last 3)   Recent Labs  08/24/13 0406 08/24/13 0715 08/24/13 1133  GLUCAP 202* 193* 282*    Resume SSI.   #7History of hypertension - resume anti hypertensives on discharge.   # Hypokalemia: replete as needed. Magnesium levels will be obtained and repeat levels in am.   Code Status: full code Family Communication: none at bedside Disposition Plan: pending.    Consultants:  GI CONSULT from Dr Loreta Ave  Psychiatry consult  Procedures:  None so far  Antibiotics:  IV clindamycin. 10/1 to 10/5  Augmentin. 10/6  HPI/Subjective:  sub sternal burning sensation improving.  coughing and pain on taking food continues.  Objective: Filed Vitals:   08/24/13 0530  BP: 138/83  Pulse: 79  Temp: 98.1 F (36.7 C)  Resp: 16    Intake/Output Summary (Last 24 hours)  at 08/24/13 1331 Last data filed at 08/24/13 1312  Gross per 24 hour  Intake    600 ml  Output    350 ml  Net    250 ml   Filed Weights   08/21/13 0514 08/22/13 0623 08/23/13 0616  Weight: 48.3 kg (106 lb 7.7 oz) 49.079 kg (108 lb 3.2 oz) 47.356 kg (104 lb 6.4 oz)    Exam:   General:   Alert afebrile comfortable  Cardiovascular: s1s2  Respiratory: scattered rhonchi  Abdomen: soft MILDLY tender in the epigastric area  Musculoskeletal: no pedal edema.   Data Reviewed: Basic Metabolic Panel:  Recent Labs Lab 08/19/13 1045 08/19/13 1346 08/20/13 0435 08/21/13 1105 08/21/13 1437 08/24/13 0500  NA 126* 127* 125* 130*  --  129*  K 3.5 3.6 3.2* 3.3*  --  3.0*  CL 94* 95* 93* 95*  --  93*  CO2 22 21 22 22   --  20  GLUCOSE 209* 144* 196* 174*  --  219*  BUN 20 18 14 7   --  8  CREATININE 0.72 0.58 0.54 0.47*  --  0.48*  CALCIUM 8.0* 8.1* 7.7* 8.0*  --  8.6  MG  --   --   --   --  1.7  --    Liver Function Tests:  Recent Labs Lab 08/19/13 0145 08/20/13 0435  AST 36 20  ALT 16 10  ALKPHOS 53 35*  BILITOT 0.9 0.9  PROT 6.1 4.9*  ALBUMIN 3.5 2.6*   No results found for this basename: LIPASE, AMYLASE,  in the last 168 hours No results found for this basename: AMMONIA,  in the last 168 hours CBC:  Recent Labs Lab 08/19/13 0145 08/19/13 0645 08/20/13 0435 08/20/13 0940 08/21/13 1105 08/22/13 1117 08/24/13 0500  WBC 17.5* 10.5 13.3* 14.5* 17.9* 16.3* 9.6  NEUTROABS 14.4* 8.0*  --   --   --   --   --   HGB 14.4 12.9 10.3* 10.6* 10.2* 10.5* 11.3*  HCT 39.8 36.1 29.1* 29.1* 28.2* 29.0* 31.3*  MCV 87.5 87.6 87.4 87.1 87.6 86.8 86.5  PLT 187 150 120* 135* 140* 186 300   Cardiac Enzymes:  Recent Labs Lab 08/19/13 0450  TROPONINI <0.30   BNP (last 3 results) No results found for this basename: PROBNP,  in the last 8760 hours CBG:  Recent Labs Lab 08/23/13 2016 08/23/13 2323 08/24/13 0406 08/24/13 0715 08/24/13 1133  GLUCAP 115* 164* 202* 193* 282*    No results found for this or any previous visit (from the past 240 hour(s)).   Studies: No results found.  Scheduled Meds: . albuterol  2.5 mg Nebulization Q6H WA  . brimonidine  1 drop Both Eyes BID  . dorzolamide-timolol  1 drop Both Eyes BID  . insulin aspart  0-15 Units  Subcutaneous TID WC  . insulin aspart  0-5 Units Subcutaneous QHS  . pantoprazole (PROTONIX) IV  40 mg Intravenous Q12H  . potassium chloride  40 mEq Oral Daily  . predniSONE  40 mg Oral QAC breakfast  . sodium chloride  3 mL Intravenous Q12H  . sucralfate  1 g Oral QID   Continuous Infusions:    Principal Problem:   Suicide attempt Active Problems:   Diabetes mellitus   HTN (hypertension)   Pneumonitis    Time spent: 20 min    Robin Perry  [facility] Pager [phone] If 7PM-7AM, please contact night-coverage at [email or web address], password [facility] 08/24/2013, 1:31 PM  LOS: 5 days

## 2013-08-25 LAB — BASIC METABOLIC PANEL
BUN: 7 mg/dL (ref 6–23)
CO2: 23 mEq/L (ref 19–32)
Chloride: 94 mEq/L — ABNORMAL LOW (ref 96–112)
GFR calc Af Amer: >90 mL/min (ref >90)
GFR calc non Af Amer: >90 mL/min (ref >90)
Potassium: 3.7 mEq/L (ref 3.5–5.1)
Sodium: 129 mEq/L — ABNORMAL LOW (ref 135–145)

## 2013-08-25 LAB — GLUCOSE, CAPILLARY
Glucose-Capillary: 226 mg/dL — ABNORMAL HIGH (ref 70–99)
Glucose-Capillary: 200 mg/dL — ABNORMAL HIGH (ref 70–99)

## 2013-08-25 MED ORDER — PREDNISONE 20 MG PO TABS: ORAL_TABLET | ORAL | Status: DC

## 2013-08-25 MED ORDER — ALBUTEROL SULFATE (5 MG/ML) 0.5% IN NEBU: 2.5000 mg | INHALATION_SOLUTION | RESPIRATORY_TRACT | Status: AC | PRN

## 2013-08-25 MED ORDER — GI COCKTAIL ~~LOC~~: 30.0000 mL | Freq: Two times a day (BID) | ORAL | Status: AC | PRN

## 2013-08-25 MED ORDER — SUCRALFATE 1 GM/10ML PO SUSP: 1.0000 g | Freq: Four times a day (QID) | ORAL | Status: AC | PRN

## 2013-08-25 MED ORDER — GUAIFENESIN 100 MG/5ML PO SOLN: 5.0000 mL | ORAL | Status: AC | PRN

## 2013-08-25 MED ORDER — PANTOPRAZOLE SODIUM 40 MG PO TBEC: 40.0000 mg | DELAYED_RELEASE_TABLET | Freq: Two times a day (BID) | ORAL | Status: AC

## 2013-08-25 NOTE — Progress Notes (Signed)
Clinical Social Work  CSW spoke with Nicholos Johns at [facility] who reports patient has been admitted to their facility but cannot be transferred until this afternoon once they DC patients. Patient signed voluntary forms which was faxed to [facility]. CSW informed RN and MD of DC plans. Sandre Kitty will call CSW once they are ready to admit patient.  [place], [place] [phone]

## 2013-08-25 NOTE — Progress Notes (Signed)
Clinical Social Work  [facility] called and agreeable to accept patient now. RN to call report to [phone]. Patient and family aware of DC plans. CSW coordinated transportation via [place]. Request #: [id number].  CSW is signing off but available if needed.  [place], [place] [phone]

## 2013-08-25 NOTE — Progress Notes (Signed)
Inpatient Diabetes Program Recommendations  AACE/ADA: New Consensus Statement on Inpatient Glycemic Control (2013)  Target Ranges:  Prepandial:   less than 140 mg/dL      Peak postprandial:   less than 180 mg/dL (1-2 hours)      Critically ill patients:  140 - 180 mg/dL   Reason for Visit: Hyperglycemia  Results for ADHYA, COCCO (MRN [id number]) as of 08/25/2013 10:36  Ref. Range 08/24/2013 11:33 08/24/2013 16:51 08/24/2013 20:11 08/24/2013 22:02 08/25/2013 07:08  Glucose-Capillary Latest Range: 70-99 mg/dL 409 (H) 811 (H) 914 (H) 259 (H) 226 (H)    Inpatient Diabetes Program Recommendations Insulin - Basal: Add Lantus 5 units QHS Insulin - Meal Coverage: Add Novolog 3 units tidwc for meal coverage insulin if pt eats >50% meal Restart Tradjenta 5 mg QD (on Januvia 100mg  QD at home)  Note: Will continue to follow. Thank you. Ailene Ards, RD, LDN, CDE Inpatient Diabetes Coordinator [phone]

## 2013-08-25 NOTE — Progress Notes (Signed)
Clinical Social Work  CSW faxed MD notes regarding sodium levels to [facility] for review. [facility] reports available beds and that MD will review information. CSW left a message at [facility] and will continue to follow.  [place], [place] [phone]

## 2013-08-25 NOTE — Discharge Summary (Signed)
Physician Discharge Summary  Robin Perry ZOX:[id number] DOB: 1946-04-27 DOA: 08/19/2013  PCP: No primary provider on file.  Admit date: 08/19/2013 Discharge date: 08/25/2013  Time spent: 35 minutes  Recommendations for Outpatient Follow-up:  Follow up with Dr Loreta Ave in 1 to 2 weeks. Follow u pwith CXR in one week to make sure pneumonitis has resolved Follow up with PCP in one week Follow up with BMP Tto check sodium level.   Discharge Diagnoses:  Principal Problem:   Suicide attempt Active Problems:   Diabetes mellitus   HTN (hypertension)   Pneumonitis chronic hyponatremia  Discharge Condition: improved.  Diet recommendation: carb modified dysphagia 3 diet till she sees Dr Loreta Ave [facility].   Filed Weights   08/22/13 0623 08/23/13 0616 08/25/13 0546  Weight: 49.079 kg (108 lb 3.2 oz) 47.356 kg (104 lb 6.4 oz) 48.172 kg (106 lb 3.2 oz)    History of present illness:  Robin Perry is a 67 y.o. female history of diabetes mellitus type 2, hypertension was brought to the ER after patient admitted to her husband that she had taken Clorox. Patient was feeling depressed yesterday morning when she woke up and then she took 2 cups of Clorox(do not know the exact quantity) with a handful of her home medications which includes metformin/glipizide/lisinopril/Tylenol. Patient otherwise is hemodynamically stable. Salicylates and Tylenol levels are negative. Urine drug screen is negative. Poison control was consulted. At this time they have recommended observation and if patient continues to have bleeding and needs GI evaluation for any persistent bleed from the Clorox. CT CHEST shows Thickened, indistinct esophageal and gastric walls concerning for esophagitis and gastritis a. Adjacent scratch head free fluid adjacent to the stomach and spleen in the left upper abdomen. Airspace disease in the right middle lobe concerning for pneumonia  or pneumonitis. Dependent atelectasis in the lungs. GI consulted,  and she was started on IV protonix and IV clindamycin. Repeat cxr shows improvement in the pneumonitis and she completed 7 days of antibiotics. Her sodium on admission was 122, and it i;mproved to 130 and 129. She appears to have chronic hyponatremia. Her serum osmo is 278 appears adequate. It could also be from the hyperglycemia leading to pseudo hyponatremia. But she remains asymptomatic . Recommend checking another BMP in one week .    Hospital Course:  Suicidal attempt with multiple drugs - suicide precautions with sitter. Patient was hydrated and cell counts watched.  Psychiatry consulted recommended inpatient psych admission when medically ready. Asked the pt if she is still having suicidal thoughts, she thought for a few seconds and answered ,  feel she is not telling the truth. Psychiatry social worker is working to get a bed at [facility].  #2. GI bleed - probably secondary to Clorox ingestion. She was started on IV protonix, will change to po protonix as she is on soft diet... Consulted GI. Stable H&H. She was started on clears and advanced to soft. She continues to have coughing on eating, but improved when compared to last 2 days.  #3 Possible pneumonitis/pneumonia - CT chest is done and showing pneumonitis. Patient has been placed on clindamycin for possible aspiration pneumonitis. When necessary albuterol. She completed 7 days of antibiotics.Will also do trial of prednisone.  #4 Severe hyponatremia - possibly from the electrolyte imbalance from the intoxication vs mild dehydration. She was given IV fluids and her sodium improved to 130 on 10/5 . Evaluate for SIADH, with serum osmolality which was 278 and urine sodium was 35.  She could also have a component of pseudo hyponatremia from the hyperglycemia. She remains asymptomatic and she can be followed up with a repeat BMP in one week.  #5 Chest pain - probably secondary to Clorox ingestion. Troponin negative.  Improved.  #6 Diabetes mellitus type  2 - presently patient is being placed on sliding-scale coverage in the hospital. Resume home medications on discharge.  CBG (last 3)   Recent Labs   08/24/13 0406  08/24/13 0715  08/24/13 1133   GLUCAP  202*  193*  282*     #7History of hypertension - resume anti hypertensives on discharge.  # Hypokalemia: repleted as needed.    Procedures:  CT CHEST  Consultations:  PSYCHIATRY  GI  Discharge Exam: Filed Vitals:   08/25/13 0546  BP: 145/85  Pulse: 89  Temp: 97.5 F (36.4 C)  Resp: 16    General: Alert afebrile comfortable  Cardiovascular: s1s2  Respiratory: CTAB Abdomen: soft tenderness in the epigastric area has imrpoved. Musculoskeletal: no pedal edema.    Discharge Instructions      Discharge Orders   Future Orders Complete By Expires   Discharge instructions  As directed    Comments:     Follow up with PCP in one week  Follow u pwith CXR in one week to make sure the pneumonitis has completely resolved Follow up with Dr Loreta Ave in 1 to 2 weeks.       Medication List    STOP taking these medications       ranitidine 150 MG capsule  Commonly known as:  ZANTAC      TAKE these medications       albuterol (5 MG/ML) 0.5% nebulizer solution  Commonly known as:  PROVENTIL  Take 0.5 mLs (2.5 mg total) by nebulization every 2 (two) hours as needed for wheezing or shortness of breath.     brimonidine 0.2 % ophthalmic solution  Commonly known as:  ALPHAGAN  Place 1 drop into both eyes 2 (two) times daily.     CALCIUM-VITAMIN D PO  Take 1 tablet by mouth daily.     dorzolamide-timolol 22.3-6.8 MG/ML ophthalmic solution  Commonly known as:  COSOPT  Place 1 drop into both eyes 2 (two) times daily.     gi cocktail Susp suspension  Take 30 mLs by mouth 2 (two) times daily as needed for indigestion. Shake well.     glipiZIDE-metformin 5-500 MG per tablet  Commonly known as:  METAGLIP  Take 1 tablet by mouth 2 (two) times daily before a meal.      guaiFENesin 100 MG/5ML Soln  Commonly known as:  ROBITUSSIN  Take 5 mLs (100 mg total) by mouth every 4 (four) hours as needed.     lisinopril 10 MG tablet  Commonly known as:  PRINIVIL,ZESTRIL  Take 10 mg by mouth daily.     pantoprazole 40 MG tablet  Commonly known as:  PROTONIX  Take 1 tablet (40 mg total) by mouth 2 (two) times daily.     predniSONE 20 MG tablet  Commonly known as:  DELTASONE  - Prednisone 40 mg daily for 1 day followed by  - Prednisone 20 mg daily for 3 days followed by  - Prednisone 10 mg daily for 3 days.     sitaGLIPtin 100 MG tablet  Commonly known as:  JANUVIA  Take 100 mg by mouth daily.     sucralfate 1 GM/10ML suspension  Commonly known as:  CARAFATE  Take 10 mLs (  1 g total) by mouth 4 (four) times daily as needed.     triamcinolone cream 0.1 %  Commonly known as:  KENALOG  Apply 1 application topically 2 (two) times daily as needed (rash).       No Known Allergies    The results of significant diagnostics from this hospitalization (including imaging, microbiology, ancillary and laboratory) are listed below for reference.    Significant Diagnostic Studies: Dg Chest 2 View  08/21/2013   CLINICAL DATA:  Resolution of pneumonitis  EXAM: CHEST  2 VIEW  COMPARISON:  08/19/2013  FINDINGS: Right middle lobe infiltrate shows partial clearing.  Interval development of small bilateral pleural effusions. Question vascular congestion and fluid overload.  IMPRESSION: Partial clearing of right middle lobe infiltrate.  Interval development of small effusions and vascular congestion, question mild fluid overload.   Electronically Signed   By: Marlan Palau M.D.   On: 08/21/2013 10:35   Dg Chest 2 View  08/19/2013   CLINICAL DATA:  Swallowed bleach.  Cough.  EXAM: CHEST  2 VIEW  COMPARISON:  None.  FINDINGS: Airspace opacity in the right middle lobe and possibly within the lingula. Heart is normal size. No effusions or acute bony abnormality.  IMPRESSION:  Right middle lobe consolidation. Possible early opacity in the lingula. Findings concerning for pneumonia or pneumonitis.   Electronically Signed   By: Charlett Nose M.D.   On: 08/19/2013 02:39   Ct Chest W Contrast  08/19/2013   CLINICAL DATA:  Swallowed bleach. Vomiting blood.  EXAM: CT CHEST WITH CONTRAST  TECHNIQUE: Multidetector CT imaging of the chest was performed during intravenous contrast administration.  CONTRAST:  80mL OMNIPAQUE IOHEXOL 300 MG/ML  SOLN  COMPARISON:  CHEST x-ray earlier today.  FINDINGS: There appears to be diffuse wall thickening and surrounding haziness about the entire esophagus. This could represent changes of esophagitis related to caustic ingestion. No pneumomediastinum to suggest esophageal perforation. Heart is normal size. Scattered coronary artery calcifications. Ascending aortic diameter is slightly ectatic at 3.8 cm. No mediastinal, hilar, or axillary adenopathy.  Airspace disease noted in the right middle lobe concerning for pneumonia or pneumonitis. Dependent atelectasis posteriorly in the lower lobes. No pleural effusions.  Chest wall soft tissues are unremarkable. No acute bony abnormality.  Imaging into the upper abdomen demonstrates edematous, indistinct gastric walls concerning for gastritis. There is free fluid in the left upper abdomen adjacent to the stomach and spleen.  IMPRESSION: Thickened, indistinct esophageal and gastric walls concerning for esophagitis and gastritis a. Adjacent scratch head free fluid adjacent to the stomach and spleen in the left upper abdomen.  Airspace disease in the right middle lobe concerning for pneumonia or pneumonitis. Dependent atelectasis in the lungs.  Scattered coronary artery calcifications.   Electronically Signed   By: Charlett Nose M.D.   On: 08/19/2013 06:13    Microbiology: No results found for this or any previous visit (from the past 240 hour(s)).   Labs: Basic Metabolic Panel:  Recent Labs Lab 08/19/13 1346  08/20/13 0435 08/21/13 1105 08/21/13 1437 08/24/13 0500 08/25/13 0545  NA 127* 125* 130*  --  129* 129*  K 3.6 3.2* 3.3*  --  3.0* 3.7  CL 95* 93* 95*  --  93* 94*  CO2 21 22 22   --  20 23  GLUCOSE 144* 196* 174*  --  219* 258*  BUN 18 14 7   --  8 7  CREATININE 0.58 0.54 0.47*  --  0.48* 0.54  CALCIUM  8.1* 7.7* 8.0*  --  8.6 8.7  MG  --   --   --  1.7  --   --    Liver Function Tests:  Recent Labs Lab 08/19/13 0145 08/20/13 0435  AST 36 20  ALT 16 10  ALKPHOS 53 35*  BILITOT 0.9 0.9  PROT 6.1 4.9*  ALBUMIN 3.5 2.6*   No results found for this basename: LIPASE, AMYLASE,  in the last 168 hours No results found for this basename: AMMONIA,  in the last 168 hours CBC:  Recent Labs Lab 08/19/13 0145 08/19/13 0645 08/20/13 0435 08/20/13 0940 08/21/13 1105 08/22/13 1117 08/24/13 0500  WBC 17.5* 10.5 13.3* 14.5* 17.9* 16.3* 9.6  NEUTROABS 14.4* 8.0*  --   --   --   --   --   HGB 14.4 12.9 10.3* 10.6* 10.2* 10.5* 11.3*  HCT 39.8 36.1 29.1* 29.1* 28.2* 29.0* 31.3*  MCV 87.5 87.6 87.4 87.1 87.6 86.8 86.5  PLT 187 150 120* 135* 140* 186 300   Cardiac Enzymes:  Recent Labs Lab 08/19/13 0450  TROPONINI <0.30   BNP: BNP (last 3 results) No results found for this basename: PROBNP,  in the last 8760 hours CBG:  Recent Labs Lab 08/24/13 1651 08/24/13 2011 08/24/13 2202 08/25/13 0708 08/25/13 1125  GLUCAP 225* 273* 259* 226* 200*       Signed:  Eriverto Byrnes  [facility] 08/25/2013, 1:37 PM

## 2013-09-22 ENCOUNTER — Ambulatory Visit (INDEPENDENT_AMBULATORY_CARE_PROVIDER_SITE_OTHER): Payer: Medicare Other | Admitting: Family

## 2013-09-22 ENCOUNTER — Encounter: Payer: Self-pay | Admitting: Family

## 2013-09-22 VITALS — BP 118/68 | HR 88 | Wt 109.0 lb

## 2013-09-22 DIAGNOSIS — X838XXA Intentional self-harm by other specified means, initial encounter: Secondary | ICD-10-CM

## 2013-09-22 DIAGNOSIS — E119 Type 2 diabetes mellitus without complications: Secondary | ICD-10-CM

## 2013-09-22 DIAGNOSIS — F329 Major depressive disorder, single episode, unspecified: Secondary | ICD-10-CM

## 2013-09-22 DIAGNOSIS — T1491XA Suicide attempt, initial encounter: Secondary | ICD-10-CM

## 2013-09-22 DIAGNOSIS — G4700 Insomnia, unspecified: Secondary | ICD-10-CM

## 2013-09-22 MED ORDER — ZOLPIDEM TARTRATE 5 MG PO TABS: 5.0000 mg | ORAL_TABLET | Freq: Every evening | ORAL | Status: DC | PRN

## 2013-09-22 NOTE — Patient Instructions (Signed)

## 2013-09-23 ENCOUNTER — Encounter: Payer: Self-pay | Admitting: Family

## 2013-09-23 NOTE — Progress Notes (Signed)
Subjective:    Patient ID: Robin Perry, female    DOB: [birth year], 67 y.o.   MRN: [id number]  HPI  A 67 year old Asian female, new patient to the practice is in as a followup from the emergency department in October 1 20 4T. Patient presented to the emergency department after consuming 2 cups of Clorox and attempts to commit suicide. She's been depressed with her overall health. She has a history of hypertension, type 2 diabetes. She's not currently being treated for depression. She has a primary care provider at the hospital was unaware of. She plans to return back to see him. She also has followup appointments with GI to have an endoscopy performed on 09/29/2013. She had a colonoscopy June 2014. Currently taking Effexor and Risperdal and felt like the medication is working well. Denies any feelings of helplessness, hopelessness, thoughts of death or dying. Subjective data limited to language barrier. Interpreter present.   Continues to complain of insomnia appears requesting medication temporarily and since she sees her primary care doctor or insomnia. Reports sleeping approximately 4-5 hours nightly. Has difficulty maintaining sleep.  Review of Systems  Constitutional: Negative.   Respiratory: Negative.   Cardiovascular: Negative.   Musculoskeletal: Negative.   Skin: Negative.   Neurological: Negative.   Psychiatric/Behavioral: Positive for sleep disturbance. Negative for self-injury. The patient is nervous/anxious.    Past Medical History  Diagnosis Date  . Hypertension   . Diabetes mellitus without complication   . Depression   . Glaucoma     History   Social History  . Marital Status: Married    Spouse Name: N/A    Number of Children: N/A  . Years of Education: N/A   Occupational History  . Not on file.   Social History Main Topics  . Smoking status: Never Smoker   . Smokeless tobacco: Not on file  . Alcohol Use: No  . Drug Use: No  . Sexual Activity: Not on file    Other Topics Concern  . Not on file   Social History Narrative  . No narrative on file    Past Surgical History  Procedure Laterality Date  . Eye surgery    . Tubal ligation      Family History  Problem Relation Age of Onset  . Diabetes Mellitus II Mother   . Diabetes Mellitus II Father     No Known Allergies  Current Outpatient Prescriptions on File Prior to Visit  Medication Sig Dispense Refill  . albuterol (PROVENTIL) (5 MG/ML) 0.5% nebulizer solution Take 0.5 mLs (2.5 mg total) by nebulization every 2 (two) hours as needed for wheezing or shortness of breath.  20 mL  2  . Alum & Mag Hydroxide-Simeth (GI COCKTAIL) SUSP suspension Take 30 mLs by mouth 2 (two) times daily as needed for indigestion. Shake well.      . brimonidine (ALPHAGAN) 0.2 % ophthalmic solution Place 1 drop into both eyes 2 (two) times daily.      Marland Kitchen CALCIUM-VITAMIN D PO Take 1 tablet by mouth daily.      . dorzolamide-timolol (COSOPT) 22.3-6.8 MG/ML ophthalmic solution Place 1 drop into both eyes 2 (two) times daily.      Marland Kitchen glipiZIDE-metformin (METAGLIP) 5-500 MG per tablet Take 1 tablet by mouth 2 (two) times daily before a meal.      . lisinopril (PRINIVIL,ZESTRIL) 10 MG tablet Take 10 mg by mouth daily.      . pantoprazole (PROTONIX) 40 MG tablet Take 1 tablet (40  mg total) by mouth 2 (two) times daily.      . sitaGLIPtin (JANUVIA) 100 MG tablet Take 100 mg by mouth daily.      . sucralfate (CARAFATE) 1 GM/10ML suspension Take 10 mLs (1 g total) by mouth 4 (four) times daily as needed.  420 mL  0  . triamcinolone cream (KENALOG) 0.1 % Apply 1 application topically 2 (two) times daily as needed (rash).      Marland Kitchen guaiFENesin (ROBITUSSIN) 100 MG/5ML SOLN Take 5 mLs (100 mg total) by mouth every 4 (four) hours as needed.  1200 mL  0  . predniSONE (DELTASONE) 20 MG tablet Prednisone 40 mg daily for 1 day followed by Prednisone 20 mg daily for 3 days followed by Prednisone 10 mg daily for 3 days.       No  current facility-administered medications on file prior to visit.    BP 118/68  Pulse 88  Wt 109 lb (49.442 kg)  SpO2 98%chart    Objective:   Physical Exam  Constitutional: She is oriented to person, place, and time. She appears well-developed and well-nourished.  Neck: Normal range of motion. Neck supple.  Cardiovascular: Normal rate, regular rhythm and normal heart sounds.   Pulmonary/Chest: Effort normal and breath sounds normal.  Abdominal: Soft. Bowel sounds are normal.  Neurological: She is alert and oriented to person, place, and time.  Skin: Skin is warm and dry.  Psychiatric: She has a normal mood and affect.          Assessment & Plan:   assessment: 1. Depression 2. Suicide attempt 3. Type 2 diabetes 4. Hypertension  5. Insomnia   Plan: See primary care provider as discussed. Temporary prescription for anti-inflammatory and given until she sees her PCP. Encouraged medication compliance. Her husband has agreed to dispense the Ambien to her nightly. Encourage psychotherapy.

## 2013-11-03 ENCOUNTER — Ambulatory Visit (INDEPENDENT_AMBULATORY_CARE_PROVIDER_SITE_OTHER): Payer: 59 | Admitting: Licensed Clinical Social Worker

## 2013-11-03 DIAGNOSIS — F4320 Adjustment disorder, unspecified: Secondary | ICD-10-CM

## 2013-11-18 ENCOUNTER — Encounter: Payer: Self-pay | Admitting: Family

## 2013-11-18 ENCOUNTER — Ambulatory Visit (INDEPENDENT_AMBULATORY_CARE_PROVIDER_SITE_OTHER): Payer: Medicare Other | Admitting: Family

## 2013-11-18 VITALS — BP 110/70 | HR 75 | Wt 106.0 lb

## 2013-11-18 DIAGNOSIS — F411 Generalized anxiety disorder: Secondary | ICD-10-CM

## 2013-11-18 DIAGNOSIS — F329 Major depressive disorder, single episode, unspecified: Secondary | ICD-10-CM

## 2013-11-18 DIAGNOSIS — G4700 Insomnia, unspecified: Secondary | ICD-10-CM

## 2013-11-18 MED ORDER — VENLAFAXINE HCL ER 150 MG PO CP24: 150.0000 mg | ORAL_CAPSULE | Freq: Every day | ORAL | Status: AC

## 2013-11-18 MED ORDER — ZOLPIDEM TARTRATE 5 MG PO TABS: 5.0000 mg | ORAL_TABLET | Freq: Every evening | ORAL | Status: AC | PRN

## 2013-11-18 NOTE — Patient Instructions (Addendum)
1. Take Effexor 150 mg once daily. Do not stop. See primary care provider in 4 weeks. 2. Ambien 5 mg once daily as needed for sleep. 3. advised we are not  urgent care.

## 2013-11-18 NOTE — Progress Notes (Signed)
Subjective:    Patient ID: Robin Perry, female    DOB: 01/14/1946, 67 y.o.   MRN: [id number]  HPI  67 year old female presents today with complaints of increased anxiety and depression. She also has difficulty sleeping at night. Her primary care provider started on Risperdal 2 mg at bedtime she was having side effects of restlessness, irritability, palpitations, and constipation. Therefore, she reduced the dosage to 1 mg at bedtime and the side effects continued. She reports coming here today because she was unable to see her primary care provider. She is discontinue taking Effexor 150 mg once daily because she did not have any further refills. She has taken Ambien 5 mg in the past for insomnia that has helped.   Review of Systems  Constitutional: Negative.   HENT: Negative.   Respiratory: Negative.   Cardiovascular: Negative.   Genitourinary: Negative.   Musculoskeletal: Negative.   Skin: Negative.   Allergic/Immunologic: Negative.   Neurological: Negative.   Psychiatric/Behavioral: Positive for sleep disturbance and agitation. The patient is nervous/anxious.    Past Medical History  Diagnosis Date  . Hypertension   . Diabetes mellitus without complication   . Depression   . Glaucoma     History   Social History  . Marital Status: Married    Spouse Name: N/A    Number of Children: N/A  . Years of Education: N/A   Occupational History  . Not on file.   Social History Main Topics  . Smoking status: Never Smoker   . Smokeless tobacco: Not on file  . Alcohol Use: No  . Drug Use: No  . Sexual Activity: Not on file   Other Topics Concern  . Not on file   Social History Narrative  . No narrative on file    Past Surgical History  Procedure Laterality Date  . Eye surgery    . Tubal ligation      Family History  Problem Relation Age of Onset  . Diabetes Mellitus II Mother   . Diabetes Mellitus II Father     No Known Allergies  Current Outpatient Prescriptions  on File Prior to Visit  Medication Sig Dispense Refill  . albuterol (PROVENTIL) (5 MG/ML) 0.5% nebulizer solution Take 0.5 mLs (2.5 mg total) by nebulization every 2 (two) hours as needed for wheezing or shortness of breath.  20 mL  2  . Alum & Mag Hydroxide-Simeth (GI COCKTAIL) SUSP suspension Take 30 mLs by mouth 2 (two) times daily as needed for indigestion. Shake well.      . brimonidine (ALPHAGAN) 0.2 % ophthalmic solution Place 1 drop into both eyes 2 (two) times daily.      Marland Kitchen CALCIUM-VITAMIN D PO Take 1 tablet by mouth daily.      . dorzolamide-timolol (COSOPT) 22.3-6.8 MG/ML ophthalmic solution Place 1 drop into both eyes 2 (two) times daily.      Marland Kitchen glipiZIDE (GLUCOTROL) 5 MG tablet       . glipiZIDE-metformin (METAGLIP) 5-500 MG per tablet Take 1 tablet by mouth 2 (two) times daily before a meal.      . lisinopril (PRINIVIL,ZESTRIL) 10 MG tablet Take 10 mg by mouth daily.      . metFORMIN (GLUCOPHAGE) 1000 MG tablet       . pantoprazole (PROTONIX) 40 MG tablet Take 1 tablet (40 mg total) by mouth 2 (two) times daily.      . predniSONE (DELTASONE) 20 MG tablet Prednisone 40 mg daily for 1 day followed by  Prednisone 20 mg daily for 3 days followed by Prednisone 10 mg daily for 3 days.      . sitaGLIPtin (JANUVIA) 100 MG tablet Take 100 mg by mouth daily.      . sucralfate (CARAFATE) 1 GM/10ML suspension Take 10 mLs (1 g total) by mouth 4 (four) times daily as needed.  420 mL  0  . triamcinolone cream (KENALOG) 0.1 % Apply 1 application topically 2 (two) times daily as needed (rash).      Marland Kitchen guaiFENesin (ROBITUSSIN) 100 MG/5ML SOLN Take 5 mLs (100 mg total) by mouth every 4 (four) hours as needed.  1200 mL  0   No current facility-administered medications on file prior to visit.    BP 110/70  Pulse 75  Wt 106 lb (48.081 kg)chart    Objective:   Physical Exam  Constitutional: She is oriented to person, place, and time. She appears well-developed and well-nourished.  HENT:  Right  Ear: External ear normal.  Left Ear: External ear normal.  Nose: Nose normal.  Mouth/Throat: Oropharynx is clear and moist.  Neck: Normal range of motion. Neck supple.  Cardiovascular: Normal rate, regular rhythm and normal heart sounds.   Pulmonary/Chest: Effort normal and breath sounds normal.  Abdominal: Soft. Bowel sounds are normal.  Musculoskeletal: Normal range of motion.  Neurological: She is alert and oriented to person, place, and time.  Skin: Skin is warm and dry.  Psychiatric: She has a normal mood and affect.          Assessment & Plan:  Assessment: 1. Anxiety 2. Depression 3. Insomnia  Plan: I have advised patient that we are not an urgent care clinic. She will have to see her primary care provider in the future or further management of her medications. However, today, Ambien 5 mg once daily at bedtime. And resume Effexor 150 mg once a day. Call the office if any questions or concerns. Check as scheduled on an as needed.

## 2013-11-19 ENCOUNTER — Emergency Department (HOSPITAL_COMMUNITY)
Admission: EM | Admit: 2013-11-19 | Discharge: 2013-11-19 | Disposition: A | Discharge status: Home / Self Care | Payer: Medicare Other | Attending: Emergency Medicine | Admitting: Emergency Medicine

## 2013-11-19 ENCOUNTER — Encounter (HOSPITAL_COMMUNITY): Payer: Self-pay | Admitting: Emergency Medicine

## 2013-11-19 DIAGNOSIS — Z8669 Personal history of other diseases of the nervous system and sense organs: Secondary | ICD-10-CM | POA: Insufficient documentation

## 2013-11-19 DIAGNOSIS — F3289 Other specified depressive episodes: Secondary | ICD-10-CM | POA: Insufficient documentation

## 2013-11-19 DIAGNOSIS — E871 Hypo-osmolality and hyponatremia: Secondary | ICD-10-CM

## 2013-11-19 DIAGNOSIS — Z79899 Other long term (current) drug therapy: Secondary | ICD-10-CM | POA: Insufficient documentation

## 2013-11-19 DIAGNOSIS — F329 Major depressive disorder, single episode, unspecified: Secondary | ICD-10-CM | POA: Insufficient documentation

## 2013-11-19 DIAGNOSIS — I10 Essential (primary) hypertension: Secondary | ICD-10-CM | POA: Insufficient documentation

## 2013-11-19 DIAGNOSIS — R451 Restlessness and agitation: Secondary | ICD-10-CM

## 2013-11-19 DIAGNOSIS — E876 Hypokalemia: Secondary | ICD-10-CM | POA: Insufficient documentation

## 2013-11-19 DIAGNOSIS — R4589 Other symptoms and signs involving emotional state: Secondary | ICD-10-CM | POA: Insufficient documentation

## 2013-11-19 DIAGNOSIS — E119 Type 2 diabetes mellitus without complications: Secondary | ICD-10-CM | POA: Insufficient documentation

## 2013-11-19 LAB — BASIC METABOLIC PANEL
BUN: 12 mg/dL (ref 6–23)
CALCIUM: 9.6 mg/dL (ref 8.4–10.5)
CO2: 23 mEq/L (ref 19–32)
Chloride: 89 mEq/L — ABNORMAL LOW (ref 96–112)
Creatinine, Ser: 0.45 mg/dL — ABNORMAL LOW (ref 0.50–1.10)
GFR calc Af Amer: >90 mL/min (ref >90)
GFR calc non Af Amer: >90 mL/min (ref >90)
GLUCOSE: 294 mg/dL — AB (ref 70–99)
POTASSIUM: 4.1 meq/L (ref 3.7–5.3)
SODIUM: 130 meq/L — AB (ref 137–147)

## 2013-11-19 LAB — CBC WITH DIFFERENTIAL/PLATELET
BASOS PCT: 1 % (ref 0–1)
Basophils Absolute: 0 10*3/uL (ref 0.0–0.1)
EOS PCT: 1 % (ref 0–5)
Eosinophils Absolute: 0.1 10*3/uL (ref 0.0–0.7)
HCT: 38.4 % (ref 36.0–46.0)
Hemoglobin: 13.4 g/dL (ref 12.0–15.0)
LYMPHS ABS: 2.3 10*3/uL (ref 0.7–4.0)
Lymphocytes Relative: 27 % (ref 12–46)
MCH: 31 pg (ref 26.0–34.0)
MCHC: 34.9 g/dL (ref 30.0–36.0)
MCV: 88.9 fL (ref 78.0–100.0)
Monocytes Absolute: 0.8 10*3/uL (ref 0.1–1.0)
Monocytes Relative: 10 % (ref 3–12)
NEUTROS PCT: 62 % (ref 43–77)
Neutro Abs: 5.3 10*3/uL (ref 1.7–7.7)
PLATELETS: 312 10*3/uL (ref 150–400)
RBC: 4.32 MIL/uL (ref 3.87–5.11)
RDW: 13.3 % (ref 11.5–15.5)
WBC: 8.6 10*3/uL (ref 4.0–10.5)

## 2013-11-19 MED ORDER — MAGNESIUM CITRATE PO SOLN
1.0000 | Freq: Once | ORAL | Status: AC
Start: 2013-11-19 — End: 2013-11-19
  Administered 2013-11-19: 1 via ORAL
  Filled 2013-11-19: qty 296

## 2013-11-19 MED ORDER — LORAZEPAM 1 MG PO TABS
1.0000 mg | ORAL_TABLET | Freq: Once | ORAL | Status: AC
  Administered 2013-11-19: 1 mg via ORAL
  Filled 2013-11-19: qty 1

## 2013-11-19 MED ORDER — LORAZEPAM 1 MG PO TABS: 1.0000 mg | ORAL_TABLET | Freq: Three times a day (TID) | ORAL | Status: AC | PRN

## 2013-11-19 NOTE — ED Provider Notes (Signed)
CSN: [id number]     Arrival date & time 11/19/13  0417 History   First MD Initiated Contact with Patient 11/19/13 [phone]     Chief Complaint  Patient presents with  . Hypertension   (Consider location/radiation/quality/duration/timing/severity/associated sxs/prior Treatment) Patient is a 68 y.o. female presenting with hypertension. The history is provided by the patient.  Hypertension  She had been on risperidone and had stopped that yesterday because of side effects of anxiety, insomnia, constipation, and irregular heartbeat. She saw a physician yesterday who gave her prescriptions for Ambien and Effexor but they have not helped. This morning, she was very restless and couldn't sleep and her husband checked her blood pressure and was 200 systolic.  Past Medical History  Diagnosis Date  . Hypertension   . Diabetes mellitus without complication   . Depression   . Glaucoma    Past Surgical History  Procedure Laterality Date  . Eye surgery    . Tubal ligation     Family History  Problem Relation Age of Onset  . Diabetes Mellitus II Mother   . Diabetes Mellitus II Father    History  Substance Use Topics  . Smoking status: Never Smoker   . Smokeless tobacco: Not on file  . Alcohol Use: No   OB History   Grav Para Term Preterm Abortions TAB SAB Ect Mult Living                 Review of Systems  All other systems reviewed and are negative.    Allergies  Review of patient's allergies indicates no known allergies.  Home Medications   Current Outpatient Rx  Name  Route  Sig  Dispense  Refill  . albuterol (PROVENTIL) (5 MG/ML) 0.5% nebulizer solution   Nebulization   Take 0.5 mLs (2.5 mg total) by nebulization every 2 (two) hours as needed for wheezing or shortness of breath.   20 mL   2   . Alum & Mag Hydroxide-Simeth (GI COCKTAIL) SUSP suspension   Oral   Take 30 mLs by mouth 2 (two) times daily as needed for indigestion. Shake well.         . brimonidine  (ALPHAGAN) 0.2 % ophthalmic solution   Both Eyes   Place 1 drop into both eyes 2 (two) times daily.         Marland Kitchen CALCIUM-VITAMIN D PO   Oral   Take 1 tablet by mouth daily.         . dorzolamide-timolol (COSOPT) 22.3-6.8 MG/ML ophthalmic solution   Both Eyes   Place 1 drop into both eyes 2 (two) times daily.         Marland Kitchen guaiFENesin (ROBITUSSIN) 100 MG/5ML SOLN   Oral   Take 5 mLs (100 mg total) by mouth every 4 (four) hours as needed.   1200 mL   0   . lisinopril (PRINIVIL,ZESTRIL) 10 MG tablet   Oral   Take 10 mg by mouth daily.         . metFORMIN (GLUCOPHAGE) 1000 MG tablet   Oral   Take 1,000 mg by mouth 2 (two) times daily with a meal.          . pantoprazole (PROTONIX) 40 MG tablet   Oral   Take 1 tablet (40 mg total) by mouth 2 (two) times daily.         . sitaGLIPtin (JANUVIA) 100 MG tablet   Oral   Take 100 mg by mouth daily.         Marland Kitchen  sucralfate (CARAFATE) 1 GM/10ML suspension   Oral   Take 10 mLs (1 g total) by mouth 4 (four) times daily as needed.   420 mL   0   . triamcinolone cream (KENALOG) 0.1 %   Topical   Apply 1 application topically 2 (two) times daily as needed (rash).         . venlafaxine XR (EFFEXOR-XR) 150 MG 24 hr capsule   Oral   Take 1 capsule (150 mg total) by mouth daily with breakfast.   30 capsule   4   . zolpidem (AMBIEN) 5 MG tablet   Oral   Take 1 tablet (5 mg total) by mouth at bedtime as needed for sleep.   30 tablet   0    BP 164/90  Pulse 75  Temp(Src) 97.9 F (36.6 C) (Oral)  Resp 19  Wt 106 lb (48.081 kg)  SpO2 95% Physical Exam  Nursing note and vitals reviewed.  68 year old female, resting comfortably and in no acute distress. Vital signs are significant for hypertension with blood pressure 159/90. Oxygen saturation is 96%, which is normal. She is noted to be restless and unable to sit still. Head is normocephalic and atraumatic. PERRLA, EOMI. Oropharynx is clear. Neck is nontender and supple  without adenopathy or JVD. Back is nontender and there is no CVA tenderness. Lungs are clear without rales, wheezes, or rhonchi. Chest is nontender. Heart has regular rate and rhythm with frequent premature beats. There is no murmur. Abdomen is soft, flat, nontender without masses or hepatosplenomegaly and peristalsis is normoactive. Extremities have no cyanosis or edema, full range of motion is present. Skin is warm and dry without rash. Neurologic: She is awake, alert, oriented, cranial nerves are intact, there are no motor or sensory deficits.  ED Course  Procedures (including critical care time) Labs Review Results for orders placed during the hospital encounter of 11/19/13  CBC WITH DIFFERENTIAL      Result Value Range   WBC 8.6  4.0 - 10.5 K/uL   RBC 4.32  3.87 - 5.11 MIL/uL   Hemoglobin 13.4  12.0 - 15.0 g/dL   HCT 45.4  09.8 - 11.9 %   MCV 88.9  78.0 - 100.0 fL   MCH 31.0  26.0 - 34.0 pg   MCHC 34.9  30.0 - 36.0 g/dL   RDW 14.7  82.9 - 56.2 %   Platelets 312  150 - 400 K/uL   Neutrophils Relative % 62  43 - 77 %   Neutro Abs 5.3  1.7 - 7.7 K/uL   Lymphocytes Relative 27  12 - 46 %   Lymphs Abs 2.3  0.7 - 4.0 K/uL   Monocytes Relative 10  3 - 12 %   Monocytes Absolute 0.8  0.1 - 1.0 K/uL   Eosinophils Relative 1  0 - 5 %   Eosinophils Absolute 0.1  0.0 - 0.7 K/uL   Basophils Relative 1  0 - 1 %   Basophils Absolute 0.0  0.0 - 0.1 K/uL  BASIC METABOLIC PANEL      Result Value Range   Sodium 130 (*) 137 - 147 mEq/L   Potassium 4.1  3.7 - 5.3 mEq/L   Chloride 89 (*) 96 - 112 mEq/L   CO2 23  19 - 32 mEq/L   Glucose, Bld 294 (*) 70 - 99 mg/dL   BUN 12  6 - 23 mg/dL   Creatinine, Ser 1.30 (*) 0.50 - 1.10 mg/dL  Calcium 9.6  8.4 - 10.5 mg/dL   GFR calc non Af Amer >90  >90 mL/min   GFR calc Af Amer >90  >90 mL/min   EKG Interpretation    Date/Time:  Thursday November 19 2013 04:50:29 EST Ventricular Rate:  71 PR Interval:  127 QRS Duration: 82 QT  Interval:  416 QTC Calculation: 452 R Axis:   65 Text Interpretation:  Sinus rhythm Ventricular premature complex Abnormal R-wave progression, early transition ST elevation, consider inferior injury When compared with ECG of 08/19/2013, No significant change was found Confirmed by [facility]GL[facility]  MD, Allan Bacigalupi ([id number]) on 11/19/2013 4:57:11 AM            MDM   1. Restlessness   2. Hyponatremia   3. Hypertension    Insomnia, constipation, restlessness which are probably side effects of risperidone. Old records are reviewed and she has history of hyponatremia so electrolytes will be checked. She is given a dose of lorazepam to try and get symptomatic relief and will be given a dose of magnesium citrate to treat her constipation.  Following lorazepam, and she was much calmer and blood pressure has remained at an acceptable range although still elevated. She was injected to continue taking her Effexor and is to followup with her PCP.  Dione Boozeavid Keidra Withers, MD 11/19/13 [phone]

## 2013-11-19 NOTE — ED Notes (Signed)
Pt does not speak english, her husband is speaking for her.  HR is irregular.  Pt is A&O x3 in no acute distress.

## 2013-11-19 NOTE — ED Notes (Signed)
Per husband pt started Effexor and Ambien yesterday, states pt "jerking" and c/o heart pounding, husband reports pt was hypertensive at home. HR noted to be irregular in triage. Pt and husband are unaware of afib hx.

## 2013-11-19 NOTE — Discharge Instructions (Signed)
T?ng Huy?t p (Hypertension) Khi tim ??p, n ??y mu qua cc ??ng m?ch. L?c ??y ny ???c g?i l huy?t p. N?u huy?t p qu cao, ng??i ta g?i ? l ch?ng t?ng huy?t p (HTN) hay cao huy?t p. Ch?ng t?ng huy?t p r?t nguy hi?m v b?n c th? m?c ph?i m khng hay bi?t g. Huy?t p cao c ngh?a l tim c?a b?n ph?i lm vi?c nhi?u h?n ?? b?m mu. Cc ??ng m?ch c th? b? h?p ho?c x? c?ng. T?ng cng cho tim lm b?n c nguy c? m?c b?nh tim, ??t qu?, v cc v?n ?? khc.  Huy?t p bao g?m hai con s?, s? cao h?n trn s? th?p h?n, v d?: 110/72. Huy?t p ???c ghi l "110 trn 72". L t??ng l d??i 120 cho s? trn (tm thu) v d??i 80 cho s? d??i (tm tr??ng). Ghi huy?t p c?a b?n ngy hm nay. B?n nn h?t s?c ch  ??n huy?t p c?a mnh n?u ?ang m?c ph?i nh?ng c?n b?nh nh?t ??ng nh? l:  Suy tim  Tr??c ?y b? nh?i mu c? tim  Ti?u ???ng  B?nh th?n m?n tnh  Tr??c ?y b? ??t qu?  Nhi?u nguy c? m?c b?nh tim. ?? xem c b? m?c ch?ng t?ng huy?t p hay khng, b?n nn ?o huy?t p khi ?ang ng?i v?i cnh tay ???c ??t ngang t?m tim c?a b?n. Nn ?o huy?t p t nh?t l hai l?n. ??c s? huy?t p cao m?t l?n (??c bi?t l ? [place] C?p C?u) khng c ngh?a l b?n c?n ph?i ???c ?i?u tr?. C th? c cc c?n b?nh c huy?t p khc nhau gi?a tay tri v tay ph?i. ?i?u quan tr?ng l ph?i ?i khm s?m ?? chuyn gia ch?m [place] s?c kh?e ki?m tra l?i. ?a s? m?i ng??i m?c ph?i ch?ng t?ng huy?t p nguyn pht, ?i?u ny c ngh?a l khng c nguyn nhn c? th?. C th? lm gi?m d?ng cao huy?t p ny b?ng cch thay ??i cc y?u t? v? l?i s?ng nh? l:  C?ng th?ng  Ht thu?c  Khng t?p th? d?c  Th?a cn  S? d?ng ma ty/thu?c l/r??u  Ch? ?? ?n t mu?i ?a s? ng??i ta khng c cc tri?u ch?ng do cao huy?t p cho ??n khi b?nh gy t?n h?i cho c? th?. Vi?c ?i?u tr? hi?u qu? th??ng c th? ng?n ch?n, tr hon hay gi?m m?c ?? t?n h?i ?. ?I?U TR? Khi ? xc ??nh ???c nguyn nhn, ?i?u tr? t?ng huy?t p s? nh?m vo nguyn nhn. C r?t nhi?u thu?c  ?i?u tr? ch?ng t?ng huy?t p. Cc thu?c ny c th? ???c chia ra thnh vi lo?i, v chuyn gia ch?m [place] s?c kh?e s? gip b?n ch?n cc lo?i thu?c t?t nh?t cho b?n. Thu?c c th? c cc tc d?ng ph?. B?n v chuyn gia ch?m [place] s?c kh?e nn xem l?i nh?ng tc d?ng ph? ?. N?u huy?t p v?n cao sau khi b?n ? thay ??i l?i s?ng ho?c b?t ??u dng thu?c th,  C th? c?n ph?i thay ??i (cc) thu?c.  C th? c?n ph?i gi?i quy?t ??n nh?ng v?n ?? khc.  Ph?i ch?c ch?n r?ng b?n hi?u toa thu?c, v bi?t r khi no dng thu?c v dng thu?c nh? th? no.  Ph?i ch?c ch?n r?ng b?n g?p chuyn gia ch?m [place] s?c kh?e ?? ti khm trong khung th?i gian ? ???c khuy?n co (th??ng l trong vng hai tu?n) ?? ki?m tra l?i huy?t  p v xem l?i thu?c.  N?u dng nhi?u lo?i thu?c ?? h? huy?t p, ph?i ch?c ch?n r?ng b?n bi?t khi no nn dng thu?c v dng nh? th? no. Dng cng m?t lc hai lo?i thu?c c th? d?n ??n huy?t p h? qu th?p. ?I KHM NGAY L?P T?C N?U:  B?n b? nh?c ??u d? d?i, th? l?c thay ??i hay b? m?, ho?c l l?n.  B?n b? y?u ho?c t b?t th??ng, ho?c c c?m gic ng?t.  ?au b?ng hay ng?c tr?m tr?ng, i m?a, ho?c kh th?. HY CH?C CH?N R?NG B?N:  Hi?u nh?ng ch? d?n ny.  S? theo di tnh tr?ng c?a b?n.  S? nh?n ???c s? gip ?? ngay l?p t?c n?u b?n khng ?? ho?c tnh tr?ng tr?m tr?ng h?n. Document Released: 11/05/2005 Document Revised: 07/08/2013 [facility] Patient Information 2014 [place], [place].  C?n Ho?ng lo?n V Lo u (Anxiety and Panic Attacks) Bc s? cho bi?t r?ng b?n ?ang b? c?n ho?ng lo?n ho?c lo u. B?nh ny c nhi?u d?ng. Ni chung cc c?n ny th??ng x?y ra ??t ng?t v khng c bo tr??c. C th? x?y ra vo b?t k? th?i ?i?m no trong ngy, k? c? khi ?ang ng?, v vo b?t k? giai ?o?n no trong cu?c ??i. Cc c?n ny c th? m?nh v khng gi?i thch ???c. M?c d cc c?n ho?ng lo?n l r?t ?ng s?, nh?ng chng v h?i v? m?t th? ch?t. ?i khi khng r nguyn nhn gy ra lo u. Lo u l m?t c? ch? b?o v? c?a c? th?  d??i d?ng ??u tranh ho?c c? ch? ??u tranh. H?u h?t cc tnh hu?ng nguy hi?m ny ???c hi?u l nh?ng tnh hu?ng th?c s? c tnh phi v?t th? (ch?ng h?n nh? lo m?t vi?c lm). NGUYN NHN C r?t nhi?u nguyn nhn gy ra lo u hay c?n ho?ng lo?n. C?n ho?ng lo?n c th? x?y ra ? nh?ng ng??i kh?e m?nh khc trong m?t t?p h?p tnh hu?ng nh?t ??nh. C th? c m?t nguyn nhn di truy?n gy ra cc c?n ho?ng lo?n. M?t s? lo?i thu?c c?ng c th? c tc d?ng ph? gy lo u. TRI?U CH?NG M?t s? c?m gic th??ng g?p nh?t l:  Ho?ng h?t tr?m tr?ng.  Chng m?t, c?m gic ng?t.  Nng - l?nh b?t ch?t.  S? b? ?in.  C?m gic khng c g l th?t.  V m? hi.  Run r?y.  ?au ng?c ho?c nh?p tim nhanh (h?i h?p).  C?m gic ng?t ng?t, ngh?t th?.  C?m gic g?n t?i ngy t?n th? v ci ch?t ?ang li?n k?.  ?au nhi ? tay chn, ?i?u ny c th? l do th? qu nhi?u.  Th?c t? thay ??i (m?t th?c t?i).  B? tch r?i v?i b?n thn (m?t nhn cch). M?t s? tri?u ch?ng c th? c lm t?ng lo u ho?c c?n ho?ng lo?n. CH?N ?ON S? ?nh gi c?a chuyn gia ch?m [place] s?c kh?e s? ph? thu?c vo lo?i tri?u ch?ng m b?n g?p ph?i. Vi?c ch?n ?on lo u ho?c ho?ng lo?n khi khng c b?nh l th?c th? c th? ???c xc ??nh l m?t nguyn nhn c?a cc tri?u ch?ng ?. ?I?U TR? Ph??ng php ?i?u tr? ?? ng?n ng?a lo u v c?n ho?ng lo?n c th? bao g?m:  Trnh cc tnh hu?ng gy lo u.  T?o c?m gic yn tm v th? gin.  T?p th? d?c th??ng xuyn.  Li?u php th? gin, ch?ng h?n nh? yoga.  Tm l  tr? li?u v?i m?t bc s? tm th?n ho?c chuyn gia tr? li?u.  Trnh c ph, r??u v cc lo?i thu?c b?t h?p php.  Thu?c ???c k ??n. H?Y NGAY L?P T?C ?I KHM N?U:  B?n b? cc tri?u ch?ng c?a c?n ho?ng lo?n khc v?i cc tri?u ch?ng thng th??ng c?a b?n.  B?n c b?t c? tri?u ch?ng no ?ng lo ho?c ngy cng t?i t?. Document Released: 11/05/2005 Document Revised: 07/08/2013 [facility] Patient Information 2014 [place], [place].  Lorazepam tablets What is  this medicine? LORAZEPAM (lor A ze pam) is a benzodiazepine. It is used to treat anxiety. This medicine may be used for other purposes; ask your health care provider or pharmacist if you have questions. COMMON BRAND NAME(S): Ativan What should I tell my health care provider before I take this medicine? They need to know if you have any of these conditions: -alcohol or drug abuse problem -bipolar disorder, depression, psychosis or other mental health condition -glaucoma -kidney or liver disease -lung disease or breathing difficulties -myasthenia gravis -Parkinson's disease -seizures or a history of seizures -suicidal thoughts -an unusual or allergic reaction to lorazepam, other benzodiazepines, foods, dyes, or preservatives -pregnant or trying to get pregnant -breast-feeding How should I use this medicine? Take this medicine by mouth with a glass of water. Follow the directions on the prescription label. If it upsets your stomach, take it with food or milk. Take your medicine at regular intervals. Do not take it more often than directed. Do not stop taking except on the advice of your doctor or health care professional. Talk to your pediatrician regarding the use of this medicine in children. Special care may be needed. Overdosage: If you think you have taken too much of this medicine contact a poison control center or emergency room at once. NOTE: This medicine is only for you. Do not share this medicine with others. What if I miss a dose? If you miss a dose, take it as soon as you can. If it is almost time for your next dose, take only that dose. Do not take double or extra doses. What may interact with this medicine? -barbiturate medicines for inducing sleep or treating seizures, like phenobarbital -clozapine -medicines for depression, mental problems or psychiatric disturbances -medicines for sleep -phenytoin -probenecid -theophylline -valproic acid This list may not describe all  possible interactions. Give your health care provider a list of all the medicines, herbs, non-prescription drugs, or dietary supplements you use. Also tell them if you smoke, drink alcohol, or use illegal drugs. Some items may interact with your medicine. What should I watch for while using this medicine? Visit your doctor or health care professional for regular checks on your progress. Your body may become dependent on this medicine, ask your doctor or health care professional if you still need to take it. However, if you have been taking this medicine regularly for some time, do not suddenly stop taking it. You must gradually reduce the dose or you may get severe side effects. Ask your doctor or health care professional for advice before increasing or decreasing the dose. Even after you stop taking this medicine it can still affect your body for several days. You may get drowsy or dizzy. Do not drive, use machinery, or do anything that needs mental alertness until you know how this medicine affects you. To reduce the risk of dizzy and fainting spells, do not stand or sit up quickly, especially if you are an older patient. Alcohol may increase  dizziness and drowsiness. Avoid alcoholic drinks. Do not treat yourself for coughs, colds or allergies without asking your doctor or health care professional for advice. Some ingredients can increase possible side effects. What side effects may I notice from receiving this medicine? Side effects that you should report to your doctor or health care professional as soon as possible: -changes in vision -confusion -depression -mood changes, excitability or aggressive behavior -movement difficulty, staggering or jerky movements -muscle cramps -restlessness -weakness or tiredness Side effects that usually do not require medical attention (report to your doctor or health care professional if they continue or are bothersome): -constipation or diarrhea -difficulty  sleeping, nightmares -dizziness, drowsiness -headache -nausea, vomiting This list may not describe all possible side effects. Call your doctor for medical advice about side effects. You may report side effects to FDA at 1-800-FDA-1088. Where should I keep my medicine? Keep out of the reach of children. This medicine can be abused. Keep your medicine in a safe place to protect it from theft. Do not share this medicine with anyone. Selling or giving away this medicine is dangerous and against the law. Store at room temperature between 20 and 25 degrees C (68 and 77 degrees F). Protect from light. Keep container tightly closed. Throw away any unused medicine after the expiration date. NOTE: This sheet is a summary. It may not cover all possible information. If you have questions about this medicine, talk to your doctor, pharmacist, or health care provider.  2014, [employer]. (2008-05-07 14:58:20)

## 2015-01-08 IMAGING — CT CT CHEST W/ CM
1 series · 15 of 31 positions shown, 19 images · IV contrast (omnipaque)
Comparison: CHEST x-ray earlier today.

CLINICAL DATA: Swallowed bleach. Vomiting blood.

EXAM:
CT CHEST WITH CONTRAST
TECHNIQUE: Multidetector CT imaging of the chest was performed during
intravenous contrast administration.
CONTRAST:  80mL OMNIPAQUE IOHEXOL 300 MG/ML  SOLN

[Series 2: chest with st · axial · 0.59mm/px · z∈[+1304,+1558]mm · 15 of 57 slices shown, 19 images]
[im 3/57  mediastinal]
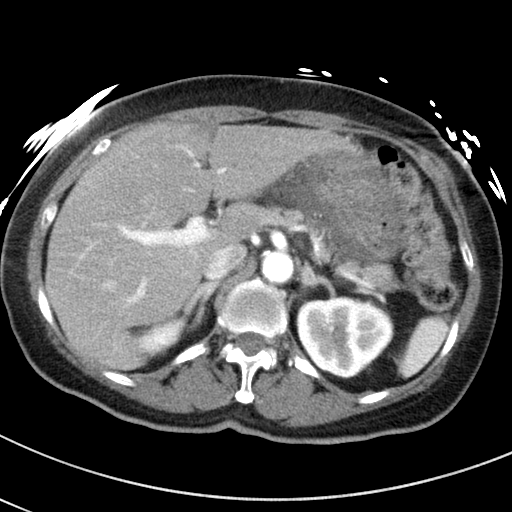
[im 3/57  lung]
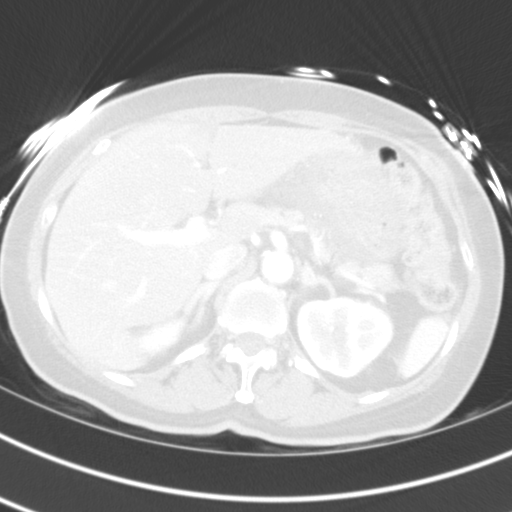
[im 7/57  lung]
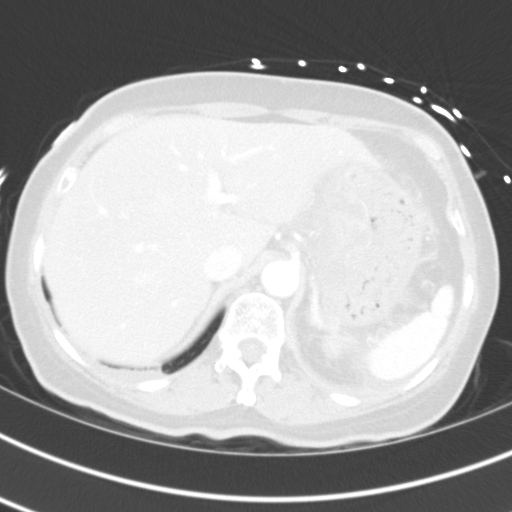
[im 11/57  lung]
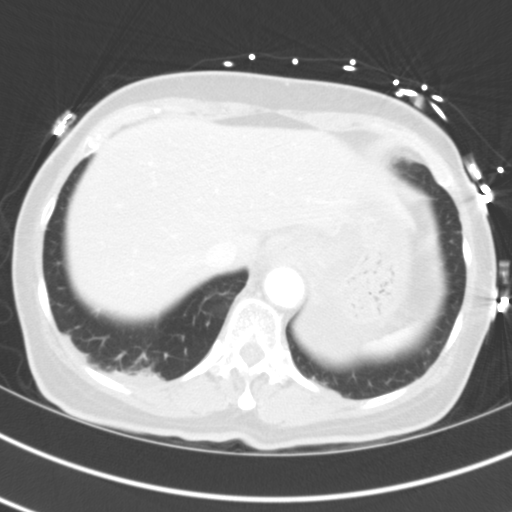
[im 13/57  lung]
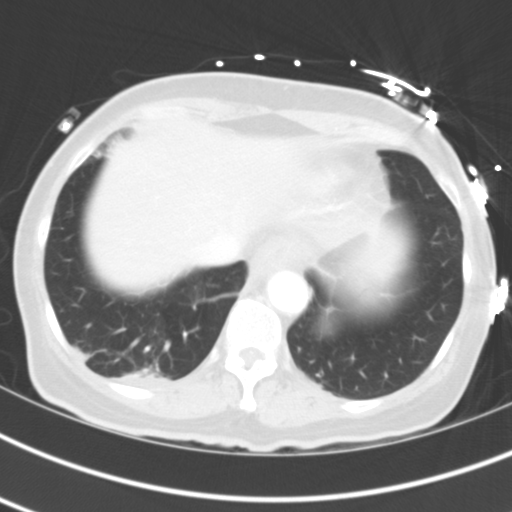
[im 17/57  mediastinal]
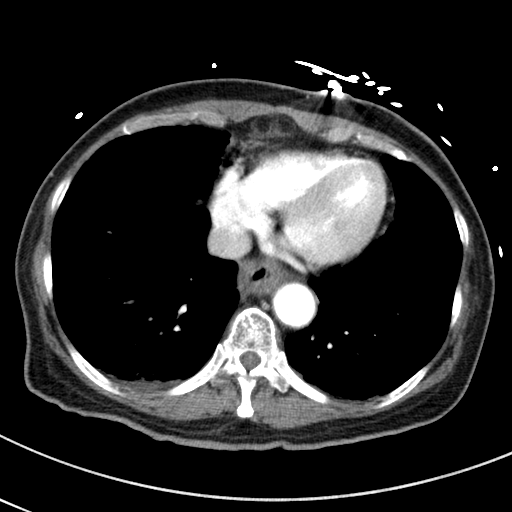
[im 17/57  lung]
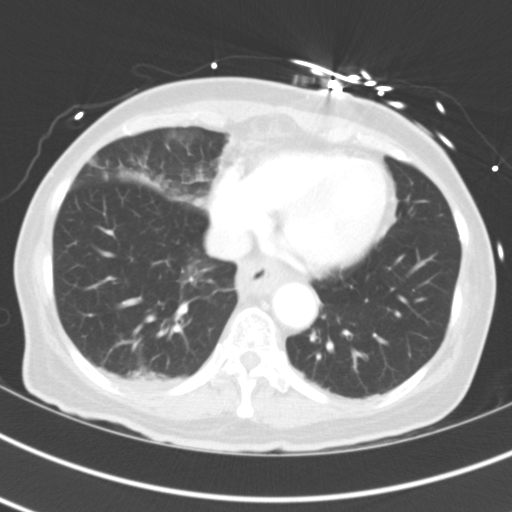
[im 21/57  lung]
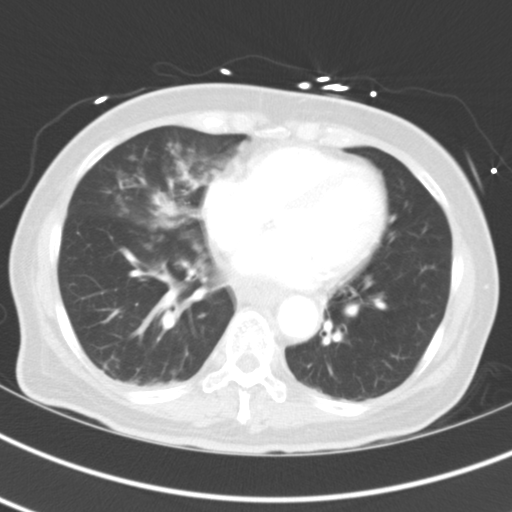
[im 25/57  lung]
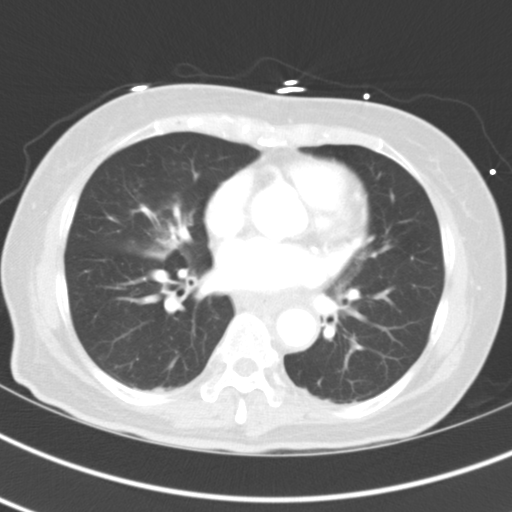
[im 30/57  lung]
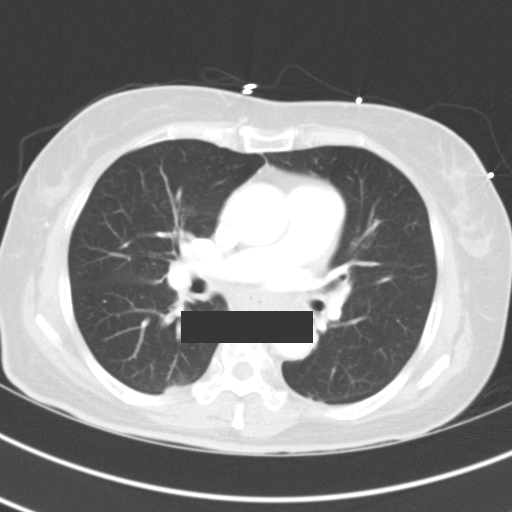
[im 32/57  mediastinal]
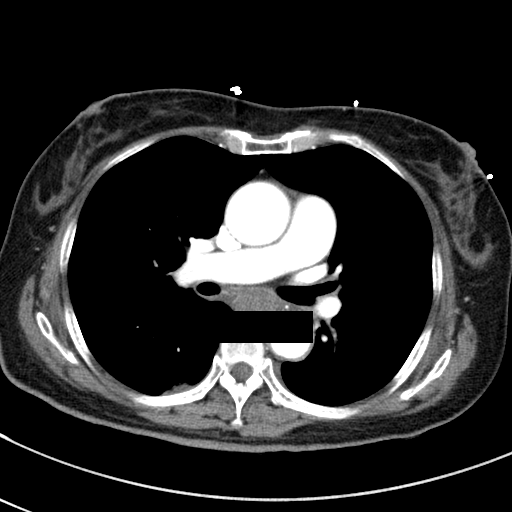
[im 32/57  lung]
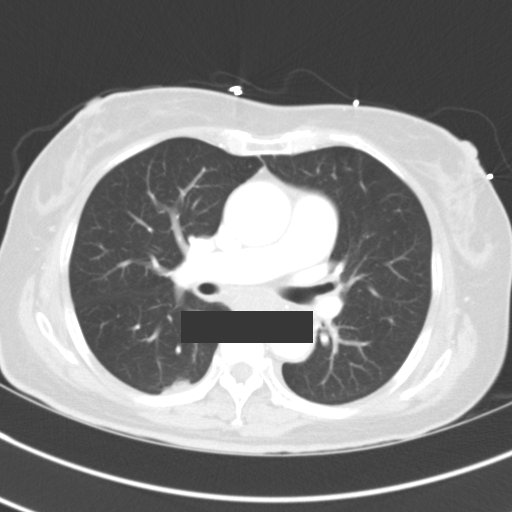
[im 36/57  lung]
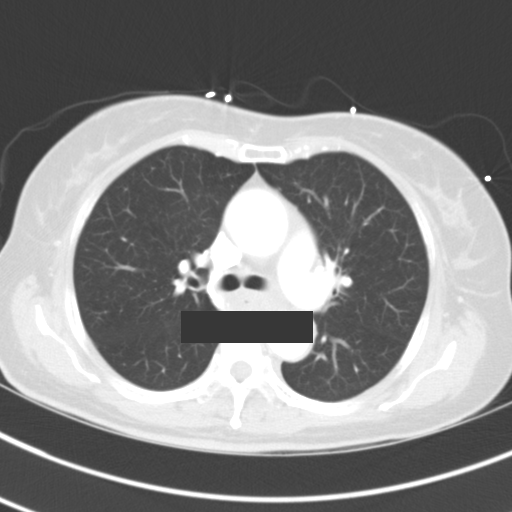
[im 40/57  lung]
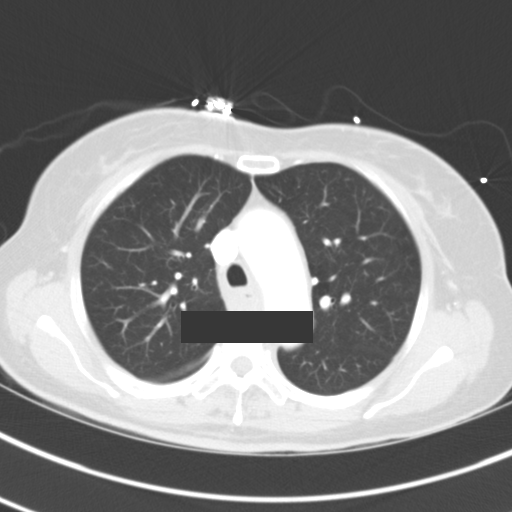
[im 44/57  lung]
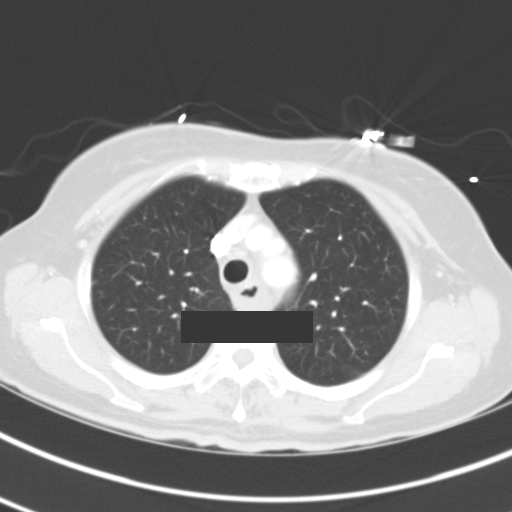
[im 46/57  mediastinal]
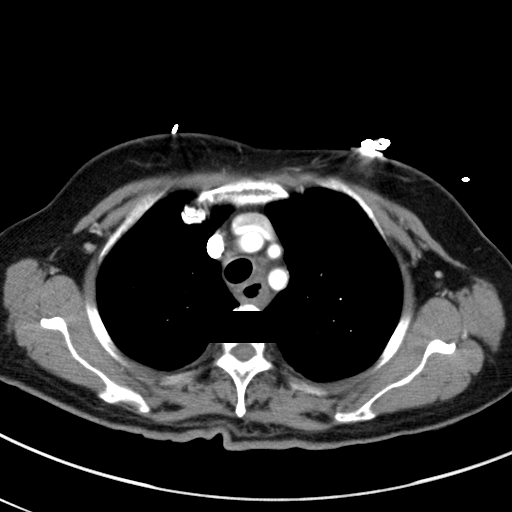
[im 46/57  lung]
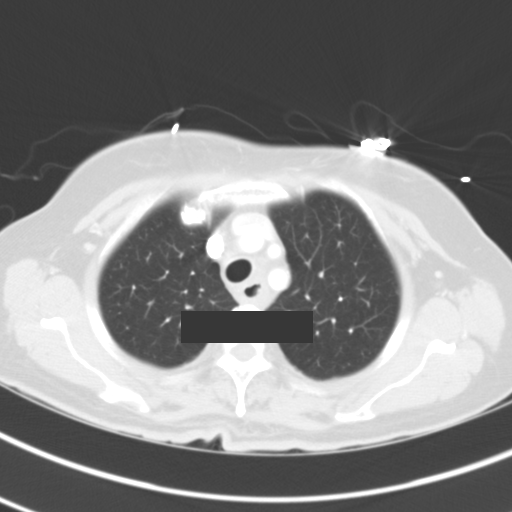
[im 50/57  lung]
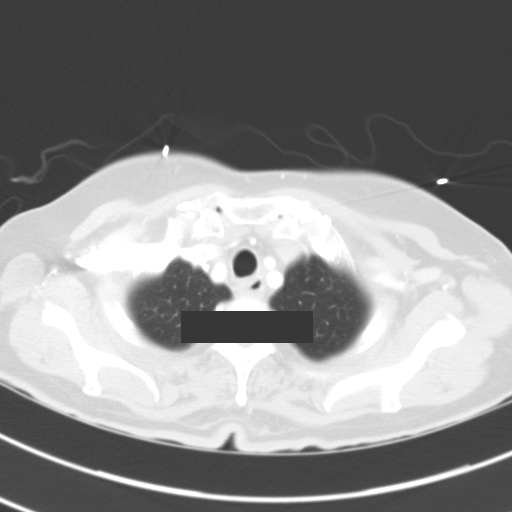
[im 54/57  lung]
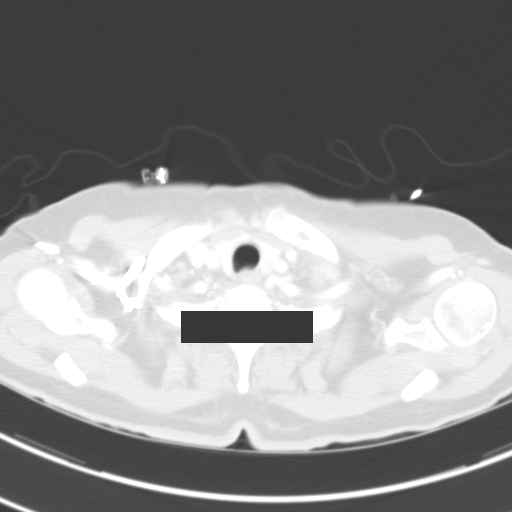

[15 of 31 positions shown; findings below may reference images not displayed]

FINDINGS: There appears to be diffuse wall thickening and surrounding haziness
about the entire esophagus. This could represent changes of
esophagitis related to caustic ingestion. No pneumomediastinum to
suggest esophageal perforation. Heart is normal size. Scattered
coronary artery calcifications. Ascending aortic diameter is
slightly ectatic at 3.8 cm. No mediastinal, hilar, or axillary
adenopathy.

Airspace disease noted in the right middle lobe concerning for
pneumonia or pneumonitis. Dependent atelectasis posteriorly in the
lower lobes. No pleural effusions.

Chest wall soft tissues are unremarkable. No acute bony abnormality.

Imaging into the upper abdomen demonstrates edematous, indistinct
gastric walls concerning for gastritis. There is free fluid in the
left upper abdomen adjacent to the stomach and spleen.
IMPRESSION: Thickened, indistinct esophageal and gastric walls concerning for
esophagitis and gastritis a. Adjacent scratch head free fluid
adjacent to the stomach and spleen in the left upper abdomen.

Airspace disease in the right middle lobe concerning for pneumonia
or pneumonitis. Dependent atelectasis in the lungs.

Scattered coronary artery calcifications.
